# Patient Record
Sex: Female | Born: 1990
Health system: Southern US, Community
[De-identification: ages and names within clinical notes are randomized; demographics above are authoritative.]

## PROBLEM LIST (undated history)

## (undated) DIAGNOSIS — O139 Gestational [pregnancy-induced] hypertension without significant proteinuria, unspecified trimester: Secondary | ICD-10-CM

## (undated) HISTORY — PX: EYE SURGERY: SHX253

## (undated) HISTORY — PX: CHOLECYSTECTOMY: SHX55

---
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
## 2016-02-03 DIAGNOSIS — N771 Vaginitis, vulvitis and vulvovaginitis in diseases classified elsewhere: Secondary | ICD-10-CM | POA: Diagnosis not present

## 2016-04-10 DIAGNOSIS — R1013 Epigastric pain: Secondary | ICD-10-CM | POA: Diagnosis not present

## 2016-04-10 DIAGNOSIS — R079 Chest pain, unspecified: Secondary | ICD-10-CM | POA: Diagnosis not present

## 2016-04-10 DIAGNOSIS — R740 Nonspecific elevation of levels of transaminase and lactic acid dehydrogenase [LDH]: Secondary | ICD-10-CM | POA: Diagnosis not present

## 2016-04-10 DIAGNOSIS — R1110 Vomiting, unspecified: Secondary | ICD-10-CM | POA: Diagnosis not present

## 2016-04-10 DIAGNOSIS — N390 Urinary tract infection, site not specified: Secondary | ICD-10-CM | POA: Diagnosis not present

## 2017-07-31 DIAGNOSIS — A5901 Trichomonal vulvovaginitis: Secondary | ICD-10-CM | POA: Diagnosis not present

## 2017-07-31 DIAGNOSIS — K219 Gastro-esophageal reflux disease without esophagitis: Secondary | ICD-10-CM | POA: Diagnosis not present

## 2017-07-31 DIAGNOSIS — Z9889 Other specified postprocedural states: Secondary | ICD-10-CM | POA: Diagnosis not present

## 2017-07-31 DIAGNOSIS — N760 Acute vaginitis: Secondary | ICD-10-CM | POA: Diagnosis not present

## 2017-07-31 DIAGNOSIS — F419 Anxiety disorder, unspecified: Secondary | ICD-10-CM | POA: Diagnosis not present

## 2017-07-31 DIAGNOSIS — A5900 Urogenital trichomoniasis, unspecified: Secondary | ICD-10-CM | POA: Diagnosis not present

## 2017-07-31 DIAGNOSIS — B9689 Other specified bacterial agents as the cause of diseases classified elsewhere: Secondary | ICD-10-CM | POA: Diagnosis not present

## 2017-07-31 DIAGNOSIS — Z9049 Acquired absence of other specified parts of digestive tract: Secondary | ICD-10-CM | POA: Diagnosis not present

## 2018-01-20 DIAGNOSIS — J069 Acute upper respiratory infection, unspecified: Secondary | ICD-10-CM | POA: Diagnosis not present

## 2018-01-20 DIAGNOSIS — R509 Fever, unspecified: Secondary | ICD-10-CM | POA: Diagnosis not present

## 2018-01-20 DIAGNOSIS — R05 Cough: Secondary | ICD-10-CM | POA: Diagnosis not present

## 2018-01-20 DIAGNOSIS — R0981 Nasal congestion: Secondary | ICD-10-CM | POA: Diagnosis not present

## 2018-01-20 DIAGNOSIS — J3489 Other specified disorders of nose and nasal sinuses: Secondary | ICD-10-CM | POA: Diagnosis not present

## 2018-03-03 ENCOUNTER — Other Ambulatory Visit: Payer: Self-pay

## 2018-03-03 ENCOUNTER — Emergency Department (HOSPITAL_COMMUNITY)
Admission: EM | Admit: 2018-03-03 | Discharge: 2018-03-04 | Disposition: A | Discharge status: Home / Self Care | Payer: Medicaid Other | Attending: Emergency Medicine | Admitting: Emergency Medicine

## 2018-03-03 ENCOUNTER — Encounter (HOSPITAL_COMMUNITY): Payer: Self-pay

## 2018-03-03 DIAGNOSIS — R319 Hematuria, unspecified: Secondary | ICD-10-CM | POA: Diagnosis not present

## 2018-03-03 DIAGNOSIS — Z5321 Procedure and treatment not carried out due to patient leaving prior to being seen by health care provider: Secondary | ICD-10-CM | POA: Diagnosis not present

## 2018-03-03 LAB — URINALYSIS, ROUTINE W REFLEX MICROSCOPIC
Bilirubin Urine: NEGATIVE
Glucose, UA: NEGATIVE mg/dL
Ketones, ur: NEGATIVE mg/dL
Nitrite: NEGATIVE
Protein, ur: 100 mg/dL — AB
RBC / HPF: >50 RBC/hpf — ABNORMAL HIGH (ref 0–5)
Specific Gravity, Urine: 1.016 (ref 1.005–1.030)
WBC, UA: >50 WBC/hpf — ABNORMAL HIGH (ref 0–5)
pH: 7 (ref 5.0–8.0)

## 2018-03-03 NOTE — ED Triage Notes (Signed)
Patient c/o hematuria and dysuria that started today.

## 2018-03-04 DIAGNOSIS — N3001 Acute cystitis with hematuria: Secondary | ICD-10-CM | POA: Diagnosis not present

## 2018-03-04 NOTE — ED Notes (Signed)
Pt called to be roomed with no response.  RN notified. 

## 2019-01-11 DIAGNOSIS — Z3201 Encounter for pregnancy test, result positive: Secondary | ICD-10-CM | POA: Diagnosis not present

## 2019-01-11 LAB — PREGNANCY, URINE: Preg Test, Ur: POSITIVE

## 2019-01-17 ENCOUNTER — Other Ambulatory Visit: Payer: Self-pay | Admitting: Family Medicine

## 2019-01-17 MED ORDER — PRENATAL MULTIVITAMIN CH: 1.0000 | ORAL_TABLET | Freq: Every day | ORAL | Status: DC

## 2019-01-17 NOTE — Addendum Note (Signed)
Addended by: Ernestina Patches on: 01/17/2019 02:11 PM   Modules accepted: Orders

## 2019-01-17 NOTE — Telephone Encounter (Signed)
The patient would like prenatal vitiamins sent over to her pharmacy. [facility] [address]

## 2019-01-18 ENCOUNTER — Telehealth: Payer: Self-pay | Admitting: Nurse Practitioner

## 2019-01-18 NOTE — Telephone Encounter (Signed)
Patient still has not gotten he Rx. Please call her today.

## 2019-01-18 NOTE — Telephone Encounter (Signed)
LM for pt that I am returning her call.  If she continues to have questions or concerns to please call the office.    Addison Naegeli, RN

## 2019-01-19 ENCOUNTER — Other Ambulatory Visit: Payer: Self-pay

## 2019-01-19 MED ORDER — PRENATAL MULTIVITAMIN CH: 1.0000 | ORAL_TABLET | Freq: Every day | ORAL | Status: DC

## 2019-01-20 ENCOUNTER — Encounter: Payer: Self-pay | Admitting: *Deleted

## 2019-01-20 ENCOUNTER — Emergency Department (HOSPITAL_COMMUNITY): Payer: Medicaid Other

## 2019-01-20 ENCOUNTER — Other Ambulatory Visit: Payer: Self-pay

## 2019-01-20 ENCOUNTER — Emergency Department (HOSPITAL_COMMUNITY)
Admission: EM | Admit: 2019-01-20 | Discharge: 2019-01-20 | Disposition: A | Discharge status: Home / Self Care | Payer: Medicaid Other | Attending: Emergency Medicine | Admitting: Emergency Medicine

## 2019-01-20 ENCOUNTER — Encounter (HOSPITAL_COMMUNITY): Payer: Self-pay

## 2019-01-20 DIAGNOSIS — Z79899 Other long term (current) drug therapy: Secondary | ICD-10-CM | POA: Diagnosis not present

## 2019-01-20 DIAGNOSIS — R109 Unspecified abdominal pain: Secondary | ICD-10-CM | POA: Diagnosis present

## 2019-01-20 DIAGNOSIS — O99331 Smoking (tobacco) complicating pregnancy, first trimester: Secondary | ICD-10-CM | POA: Insufficient documentation

## 2019-01-20 DIAGNOSIS — Z3A01 Less than 8 weeks gestation of pregnancy: Secondary | ICD-10-CM | POA: Diagnosis not present

## 2019-01-20 DIAGNOSIS — O23591 Infection of other part of genital tract in pregnancy, first trimester: Secondary | ICD-10-CM | POA: Diagnosis not present

## 2019-01-20 DIAGNOSIS — O468X1 Other antepartum hemorrhage, first trimester: Secondary | ICD-10-CM

## 2019-01-20 DIAGNOSIS — O418X10 Other specified disorders of amniotic fluid and membranes, first trimester, not applicable or unspecified: Secondary | ICD-10-CM

## 2019-01-20 DIAGNOSIS — O4691 Antepartum hemorrhage, unspecified, first trimester: Secondary | ICD-10-CM | POA: Insufficient documentation

## 2019-01-20 DIAGNOSIS — F17210 Nicotine dependence, cigarettes, uncomplicated: Secondary | ICD-10-CM | POA: Insufficient documentation

## 2019-01-20 DIAGNOSIS — O208 Other hemorrhage in early pregnancy: Secondary | ICD-10-CM | POA: Diagnosis not present

## 2019-01-20 DIAGNOSIS — B9689 Other specified bacterial agents as the cause of diseases classified elsewhere: Secondary | ICD-10-CM

## 2019-01-20 DIAGNOSIS — O99891 Other specified diseases and conditions complicating pregnancy: Secondary | ICD-10-CM | POA: Insufficient documentation

## 2019-01-20 DIAGNOSIS — R102 Pelvic and perineal pain: Secondary | ICD-10-CM | POA: Diagnosis not present

## 2019-01-20 DIAGNOSIS — N760 Acute vaginitis: Secondary | ICD-10-CM | POA: Insufficient documentation

## 2019-01-20 DIAGNOSIS — O368910 Maternal care for other specified fetal problems, first trimester, not applicable or unspecified: Secondary | ICD-10-CM | POA: Diagnosis not present

## 2019-01-20 LAB — BASIC METABOLIC PANEL
Anion gap: 8 (ref 5–15)
BUN: 9 mg/dL (ref 6–20)
CO2: 23 mmol/L (ref 22–32)
Calcium: 8.6 mg/dL — ABNORMAL LOW (ref 8.9–10.3)
Chloride: 103 mmol/L (ref 98–111)
Creatinine, Ser: 0.51 mg/dL (ref 0.44–1.00)
GFR calc Af Amer: >60 mL/min (ref >60)
GFR calc non Af Amer: >60 mL/min (ref >60)
Glucose, Bld: 89 mg/dL (ref 70–99)
Potassium: 3.7 mmol/L (ref 3.5–5.1)
Sodium: 134 mmol/L — ABNORMAL LOW (ref 135–145)

## 2019-01-20 LAB — URINALYSIS, ROUTINE W REFLEX MICROSCOPIC
Bilirubin Urine: NEGATIVE
Glucose, UA: NEGATIVE mg/dL
Hgb urine dipstick: NEGATIVE
Ketones, ur: NEGATIVE mg/dL
Nitrite: NEGATIVE
Protein, ur: NEGATIVE mg/dL
Specific Gravity, Urine: 1.019 (ref 1.005–1.030)
pH: 6 (ref 5.0–8.0)

## 2019-01-20 LAB — CBC WITH DIFFERENTIAL/PLATELET
Abs Immature Granulocytes: 0.01 10*3/uL (ref 0.00–0.07)
Basophils Absolute: 0 10*3/uL (ref 0.0–0.1)
Basophils Relative: 1 %
Eosinophils Absolute: 0.1 10*3/uL (ref 0.0–0.5)
Eosinophils Relative: 1 %
HCT: 34.9 % — ABNORMAL LOW (ref 36.0–46.0)
Hemoglobin: 10.5 g/dL — ABNORMAL LOW (ref 12.0–15.0)
Immature Granulocytes: 0 %
Lymphocytes Relative: 45 %
Lymphs Abs: 2.6 10*3/uL (ref 0.7–4.0)
MCH: 24.1 pg — ABNORMAL LOW (ref 26.0–34.0)
MCHC: 30.1 g/dL (ref 30.0–36.0)
MCV: 80.2 fL (ref 80.0–100.0)
Monocytes Absolute: 0.5 10*3/uL (ref 0.1–1.0)
Monocytes Relative: 9 %
Neutro Abs: 2.4 10*3/uL (ref 1.7–7.7)
Neutrophils Relative %: 44 %
Platelets: 413 10*3/uL — ABNORMAL HIGH (ref 150–400)
RBC: 4.35 MIL/uL (ref 3.87–5.11)
RDW: 16.5 % — ABNORMAL HIGH (ref 11.5–15.5)
WBC: 5.6 10*3/uL (ref 4.0–10.5)
nRBC: 0 % (ref 0.0–0.2)

## 2019-01-20 LAB — WET PREP, GENITAL
Sperm: NONE SEEN
Trich, Wet Prep: NONE SEEN
Yeast Wet Prep HPF POC: NONE SEEN

## 2019-01-20 LAB — POC URINE PREG, ED: Preg Test, Ur: POSITIVE — AB

## 2019-01-20 LAB — HCG, QUANTITATIVE, PREGNANCY: hCG, Beta Chain, Quant, S: 49198 m[IU]/mL — ABNORMAL HIGH (ref <5)

## 2019-01-20 LAB — ABO/RH: ABO/RH(D): O POS

## 2019-01-20 MED ORDER — METRONIDAZOLE 500 MG PO TABS: 500.0000 mg | ORAL_TABLET | Freq: Two times a day (BID) | ORAL | 0 refills | Status: DC

## 2019-01-20 MED ORDER — PREPLUS 27-1 MG PO TABS: 1.0000 | ORAL_TABLET | Freq: Every day | ORAL | 6 refills | Status: DC

## 2019-01-20 NOTE — Telephone Encounter (Signed)
Pt called upset requesting to have her PNV filled.  Per chart review, medication was not order correctly.  I advised pt that I would e-prescribe PNV and send them to her [facility] pharmacy on [address] [facility].  Pt verbalized understanding.   Addison Naegeli, RN 01/20/19

## 2019-01-20 NOTE — Discharge Instructions (Signed)
Please begin taking a prenatal vitamin with iron. Take the antibiotic, metronidazole every 12 hours until gone. Follow-up closely with women's outpatient clinic. If you develop severely worsening abdominal pain, began vaginal bleeding, or new or concerning symptoms regarding your pregnancy, you can report to the MAU at the [facility] on the [facility].

## 2019-01-20 NOTE — ED Provider Notes (Signed)
[place] DEPT Provider Note   CSN: [id number] Arrival date & time: 01/20/19  0623     History Chief Complaint  Patient presents with  . Abdominal Pain    Pregnant    Sherri Clark is a 29 y.o. female G2P1, presenting to the ED with intermittent worsening left sided abd pain that began a few days ago. Pt states symptoms initially were mild, coming and going, however last night became severe. Pain is described as intermittent sharp pains, no aggravating factors. She does endorse some vaginal itching as well, feels similar to previous yeast infection. [facility] [place] [facility]. She has upcoming appointment with [facility] out[facility] women's clinic for OB care. States it has been nearly 10 years since last pregnancy, where she delivered by emergent c-section preterm due to fetal distress. No other reported complications during previous pregnancy. No assoc n/v, d/c, urinary sx, vaginal bleeding or discharge. LMP is week before Thanksgiving holiday.  [facility] surgeries include cholecystectomy  The history is provided by the patient.       History reviewed. No pertinent past medical history.  There are no problems to display for this patient.   Past Surgical History:  Procedure Laterality Date  . CHOLECYSTECTOMY    . EYE SURGERY       OB History    Gravida  1   Para      Term      Preterm      AB      Living        SAB      TAB      Ectopic      Multiple      Live Births              Family History  Problem Relation Age of Onset  . Lupus Mother   . Hypertension Father     Social History   Tobacco Use  . Smoking status: Current Some Day Smoker    Types: Cigarettes  . Smokeless tobacco: Never Used  Substance Use Topics  . Alcohol use: Never  . Drug use: Never    Home Medications Prior to Admission medications   Medication Sig Start Date End Date Taking?  Authorizing Provider  acetaminophen (TYLENOL) 500 MG tablet Take 500 mg by mouth every 6 (six) hours as needed for mild pain or moderate pain.   Yes [provider]  metroNIDAZOLE (FLAGYL) 500 MG tablet Take 1 tablet (500 mg total) by mouth 2 (two) times daily. 01/20/19   Othella Slappey, [place] N, PA-C    Allergies    Patient has no known allergies.  Review of Systems   Review of Systems  All other systems reviewed and are negative.   Physical Exam Updated Vital Signs BP 131/67 (BP Location: Right Arm)   Pulse 79   Temp 98.8 F (37.1 C) (Oral)   Resp 16   LMP 02/22/2018   SpO2 100%   Physical Exam Vitals and nursing note reviewed.  Constitutional:      Appearance: She is well-developed.  HENT:     Head: Normocephalic and atraumatic.  Eyes:     Conjunctiva/sclera: Conjunctivae normal.  Cardiovascular:     Rate and Rhythm: Normal rate and regular rhythm.  Pulmonary:     Effort: Pulmonary effort is normal. No respiratory distress.     Breath sounds: Normal breath sounds.  Abdominal:     General: Bowel sounds are normal.  Palpations: Abdomen is soft.     Tenderness: There is no abdominal tenderness. There is no guarding or rebound.  Genitourinary:    Comments: Exam performed with female RN chaperone present.  Vulva is normal.  There is vaginal erythema present with moderate amount of white discharge present.  No CMT or adnexal tenderness. Skin:    General: Skin is warm.  Neurological:     Mental Status: She is alert.  Psychiatric:        Behavior: Behavior normal.     ED Results / Procedures / Treatments   Labs (all labs ordered are listed, but only abnormal results are displayed) Labs Reviewed  WET PREP, GENITAL - Abnormal; Notable for the following components:      Result Value   Clue Cells Wet Prep HPF POC PRESENT (*)    WBC, Wet Prep HPF POC MANY (*)    All other components within normal limits  HCG, QUANTITATIVE, PREGNANCY - Abnormal; Notable for the  following components:   hCG, Beta Chain, Quant, S 49,198 (*)    All other components within normal limits  CBC WITH DIFFERENTIAL/PLATELET - Abnormal; Notable for the following components:   Hemoglobin 10.5 (*)    HCT 34.9 (*)    MCH 24.1 (*)    RDW 16.5 (*)    Platelets 413 (*)    All other components within normal limits  BASIC METABOLIC PANEL - Abnormal; Notable for the following components:   Sodium 134 (*)    Calcium 8.6 (*)    All other components within normal limits  URINALYSIS, ROUTINE W REFLEX MICROSCOPIC - Abnormal; Notable for the following components:   APPearance CLOUDY (*)    Leukocytes,Ua MODERATE (*)    Bacteria, UA MANY (*)    All other components within normal limits  POC URINE PREG, ED - Abnormal; Notable for the following components:   Preg Test, Ur POSITIVE (*)    All other components within normal limits  ABO/RH  GC/CHLAMYDIA PROBE AMP ([place]) NOT AT [facility]    EKG None  Radiology US OB Comp < 14 Wks  Result Date: 01/20/2019 CLINICAL DATA:  Pelvic pain EXAM: OBSTETRIC <14 WK [place] AND TRANSVAGINAL OB US TECHNIQUE: Both transabdominal and transvaginal ultrasound examinations were performed for complete evaluation of the gestation as well as the maternal uterus, adnexal regions, and pelvic cul-de-sac. Transvaginal technique was performed to assess early pregnancy. COMPARISON:  None. FINDINGS: Intrauterine gestational sac: Visualized Yolk sac:  Visualized Embryo:  Visualized Cardiac Activity: Visualized Heart Rate: 165 bpm CRL:  7 mm   6 w   4 d                  [place] [facility]: September 11, 2019 Subchorionic hemorrhage: There is a 1.1 x 0.7 cm subchorionic hemorrhage. Maternal uterus/adnexae: Cervical os is closed. There is a 2.1 x 2.0 x 1.8 cm corpus luteum on the left. No other extrauterine pelvic mass. No free pelvic fluid. IMPRESSION: Single live intrauterine gestation with estimated gestational age of 6+ weeks. Subchorionic hemorrhage measuring 1.1 x 0.7 cm noted. No  extrauterine pelvic mass beyond apparent corpus luteum on the left. No appreciable free pelvic fluid. Electronically Signed   By: Bretta Bang III M.D.   On: 01/20/2019 13:27   US OB Transvaginal  Result Date: 01/20/2019 CLINICAL DATA:  Pelvic pain EXAM: OBSTETRIC <14 WK [place] AND TRANSVAGINAL OB US TECHNIQUE: Both transabdominal and transvaginal ultrasound examinations were performed for complete evaluation of the gestation  as well as the maternal uterus, adnexal regions, and pelvic cul-de-sac. Transvaginal technique was performed to assess early pregnancy. COMPARISON:  None. FINDINGS: Intrauterine gestational sac: Visualized Yolk sac:  Visualized Embryo:  Visualized Cardiac Activity: Visualized Heart Rate: 165 bpm CRL:  7 mm   6 w   4 d                  [place] [facility]: September 11, 2019 Subchorionic hemorrhage: There is a 1.1 x 0.7 cm subchorionic hemorrhage. Maternal uterus/adnexae: Cervical os is closed. There is a 2.1 x 2.0 x 1.8 cm corpus luteum on the left. No other extrauterine pelvic mass. No free pelvic fluid. IMPRESSION: Single live intrauterine gestation with estimated gestational age of 6+ weeks. Subchorionic hemorrhage measuring 1.1 x 0.7 cm noted. No extrauterine pelvic mass beyond apparent corpus luteum on the left. No appreciable free pelvic fluid. Electronically Signed   By: Bretta Bang III M.D.   On: 01/20/2019 13:27    Procedures Procedures (including critical care time)  Medications Ordered in ED Medications - No data to display  ED Course  I have reviewed the triage vital signs and the nursing notes.  Pertinent labs & imaging results that were available during my care of the patient were reviewed by me and considered in my medical decision making (see chart for details).    MDM Rules/Calculators/A&P                      Patient with recent positive pregnancy test presenting with intermittent right sided abdominal pain over the last couple of days.  No vaginal bleeding though  patient does report vaginal discomfort and itching.  On exam, patient is not currently having abdominal pain nor is her abdominal tenderness.  Pelvic exam with moderate amount of white discharge and vaginal erythema.  Wet prep with clue cells and many white cells, no trichomoniasis or yeast.  Pregnancy test is positive, quantitative hCG is 49,000.  Patient is Rh+ does not require RhoGam.  Pelvic ultrasound with live intrauterine fetus with estimated gestational age of [redacted] weeks and 4 days.  There is also noted to be a small subchorionic hemorrhage measuring 1.1 x 0.7 cm.  Discussed work-up findings with patient.  As patient is symptomatic regarding bacterial vaginosis, will treat per current guidelines with p.o. Flagyl twice daily x1 week.  Patient is aware she needs to follow closely with her OB for monitoring of subchorionic hemorrhage.  Instructed she begin taking multivitamin with iron given patient's slightly low hemoglobin today of 10.5.  Urine appears contaminated, culture sent and patient is without urinary symptoms.  Patient is aware of this.  Will discharge with strict return precautions and instructions to follow-up closely outpatient.  Patient is agreeable to plan, well-appearing and safe for discharge.  Discussed results, findings, treatment and follow up. Patient advised of return precautions. Patient verbalized understanding and agreed with plan.  Final Clinical Impression(s) / ED Diagnoses Final diagnoses:  Less than [redacted] weeks gestation of pregnancy  Subchorionic hemorrhage of placenta in first trimester, single or unspecified fetus  Bacterial vaginosis    Rx / DC Orders ED Discharge Orders         Ordered    metroNIDAZOLE (FLAGYL) 500 MG tablet  2 times daily     01/20/19 1420           Nylene Inlow, [place] N, [place] 01/20/19 1434    Pricilla Loveless, MD 01/22/19 [phone]

## 2019-01-20 NOTE — ED Triage Notes (Signed)
Pt states that she is [redacted] weeks pregnant and is having sharp abd pain. Pt states that she is also having some vaginal discomfort and is concerned she has a yeast infection.

## 2019-01-21 ENCOUNTER — Ambulatory Visit: Payer: Medicaid Other

## 2019-01-21 LAB — GC/CHLAMYDIA PROBE AMP (~~LOC~~) NOT AT ARMC
Chlamydia: NEGATIVE
Neisseria Gonorrhea: NEGATIVE

## 2019-01-31 ENCOUNTER — Ambulatory Visit (INDEPENDENT_AMBULATORY_CARE_PROVIDER_SITE_OTHER): Payer: Medicaid Other | Admitting: *Deleted

## 2019-01-31 ENCOUNTER — Encounter: Payer: Self-pay | Admitting: *Deleted

## 2019-01-31 ENCOUNTER — Other Ambulatory Visit: Payer: Self-pay

## 2019-01-31 DIAGNOSIS — Z8619 Personal history of other infectious and parasitic diseases: Secondary | ICD-10-CM

## 2019-01-31 DIAGNOSIS — O0990 Supervision of high risk pregnancy, unspecified, unspecified trimester: Secondary | ICD-10-CM

## 2019-01-31 DIAGNOSIS — O09899 Supervision of other high risk pregnancies, unspecified trimester: Secondary | ICD-10-CM

## 2019-01-31 MED ORDER — BLOOD PRESSURE KIT DEVI: 1.0000 | 0 refills | Status: DC | PRN

## 2019-01-31 NOTE — Progress Notes (Addendum)
1:24 I called Julissa for her telephone visit and heard a message " Your call cannot be completed at this time".  Jernard Reiber,RN  I connected with  Erlinda Summerson on 01/31/19 at  1:30 PM EST by telephone and verified that I am speaking with the correct person using two identifiers.   I discussed the limitations, risks, security and privacy concerns of performing an evaluation and management service by telephone and the availability of in person appointments. I also discussed with the patient that there may be a patient responsible charge related to this service. The patient expressed understanding and agreed to proceed.   Explained I am completing her New OB Intake today. We discussed Her EDD and that it is based on  Early [place] . I reviewed her allergies, meds, OB History, Medical /Surgical history, and appropriate screenings. I explained I will send her the Babyscripts app- app sent to her while on phone.  I explained we will send a blood pressure cuff prescription  to [facility] that will fill that prescription. We called the pharmacy and she verified her information and that she would pickup the cuff. I asked her to bring the blood pressure cuff with her to her first ob appointment so we can show her how to use it. Explained  then we will have her take her blood pressure weekly and enter into the app. Explained she will have some visits in office and some virtually. She already has MyChart and I assisted her with downloading the  app. I reviewed her new ob  appointment date/ time with her , our location and to wear mask, no visitors.  I explained she will have a pelvic exam, whatever remaining  ob bloodwork not done in ED, hemoglobin a1C, cbg , genetic testing if desired,- she does want a panorama,  pap if needed. I scheduled an [place] at 19 weeks and gave her the appointment. She voices understanding.   Ellakate Gonsalves,RN 01/31/2019  1:37 PM   Chart reviewed for nurse visit. Agree with plan of care.   Currie Paris, NP 01/31/2019 2:41 PM

## 2019-01-31 NOTE — Patient Instructions (Signed)

## 2019-02-09 DIAGNOSIS — O0990 Supervision of high risk pregnancy, unspecified, unspecified trimester: Secondary | ICD-10-CM | POA: Diagnosis not present

## 2019-02-28 ENCOUNTER — Encounter: Payer: Self-pay | Admitting: Nurse Practitioner

## 2019-02-28 ENCOUNTER — Other Ambulatory Visit: Payer: Self-pay

## 2019-02-28 ENCOUNTER — Other Ambulatory Visit (HOSPITAL_COMMUNITY)
Admission: RE | Admit: 2019-02-28 | Discharge: 2019-02-28 | Disposition: A | Discharge status: Home / Self Care | Payer: Medicaid Other | Source: Ambulatory Visit | Attending: Nurse Practitioner | Admitting: Nurse Practitioner

## 2019-02-28 ENCOUNTER — Ambulatory Visit (INDEPENDENT_AMBULATORY_CARE_PROVIDER_SITE_OTHER): Payer: Medicaid Other | Admitting: Nurse Practitioner

## 2019-02-28 VITALS — BP 125/68 | HR 83 | Wt 208.0 lb

## 2019-02-28 DIAGNOSIS — O0991 Supervision of high risk pregnancy, unspecified, first trimester: Secondary | ICD-10-CM | POA: Diagnosis not present

## 2019-02-28 DIAGNOSIS — O09891 Supervision of other high risk pregnancies, first trimester: Secondary | ICD-10-CM | POA: Diagnosis not present

## 2019-02-28 DIAGNOSIS — O210 Mild hyperemesis gravidarum: Secondary | ICD-10-CM | POA: Diagnosis not present

## 2019-02-28 DIAGNOSIS — O0990 Supervision of high risk pregnancy, unspecified, unspecified trimester: Secondary | ICD-10-CM | POA: Diagnosis not present

## 2019-02-28 DIAGNOSIS — O09899 Supervision of other high risk pregnancies, unspecified trimester: Secondary | ICD-10-CM

## 2019-02-28 DIAGNOSIS — Z3A12 12 weeks gestation of pregnancy: Secondary | ICD-10-CM

## 2019-02-28 DIAGNOSIS — Z98891 History of uterine scar from previous surgery: Secondary | ICD-10-CM | POA: Insufficient documentation

## 2019-02-28 DIAGNOSIS — Z6829 Body mass index (BMI) 29.0-29.9, adult: Secondary | ICD-10-CM | POA: Insufficient documentation

## 2019-02-28 MED ORDER — DOXYLAMINE-PYRIDOXINE 10-10 MG PO TBEC: DELAYED_RELEASE_TABLET | ORAL | 2 refills | Status: DC

## 2019-02-28 NOTE — Progress Notes (Signed)
Subjective:   Sherri Clark is a 29 y.o. G2P0101 at 69w1dby early ultrasound being seen today for her first obstetrical visit.  Her obstetrical history is significant for history of cesarean birth and preterm birth due to hypertension. Patient does intend to breast feed. Pregnancy history fully reviewed.  Patient reports nausea.  HISTORY: OB History  Gravida Para Term Preterm AB Living  2 1 0 1 0 1  SAB TAB Ectopic Multiple Live Births  0 0 0 0 1    # Outcome Date GA Lbr Len/2nd Weight Sex Delivery Anes PTL Lv  2 Current           1 Preterm 01/14/10 312w0d2 lb 4 oz (1.021 kg)  CS-Unspec EPI  LIV     Birth Comments: PTD; c/s fetal distress    Past Medical History:  Diagnosis Date  . Gonorrhea 2013   Past Surgical History:  Procedure Laterality Date  . CESAREAN SECTION    . CHOLECYSTECTOMY    . EYE SURGERY     Family History  Problem Relation Age of Onset  . Lupus Mother   . Hypertension Father    Social History   Tobacco Use  . Smoking status: Former Smoker    Types: Cigarettes    Quit date: 01/30/2017    Years since quitting: 2.0  . Smokeless tobacco: Never Used  Substance Use Topics  . Alcohol use: Never  . Drug use: Never   No Known Allergies Current Outpatient Medications on File Prior to Visit  Medication Sig Dispense Refill  . acetaminophen (TYLENOL) 500 MG tablet Take 500 mg by mouth every 6 (six) hours as needed for mild pain or moderate pain.    . Prenatal Vit-Fe Fumarate-FA (PREPLUS) 27-1 MG TABS Take 1 tablet by mouth daily. 30 tablet 6  . Blood Pressure Monitoring (BLOOD PRESSURE KIT) DEVI 1 Device by Does not apply route as needed. (Patient not taking: Reported on 02/28/2019) 1 each 0   Current Facility-Administered Medications on File Prior to Visit  Medication Dose Route Frequency Provider Last Rate Last Admin  . prenatal multivitamin tablet 1 tablet  1 tablet Oral Q1200 Burleson, Terri L, NP      . prenatal multivitamin tablet 1  tablet  1 tablet Oral Q1200 ArWoodroe ModeMD         Exam   Vitals:   02/28/19 0855  BP: 125/68  Pulse: 83  Weight: 208 lb (94.3 kg)   Fetal Heart Rate (bpm): 142  Uterus:     Pelvic Exam: Perineum: no hemorrhoids, normal perineum   Vulva: normal external genitalia, no lesions   Vagina:  normal mucosa, normal discharge   Cervix: no lesions and normal, pap smear done.    Adnexa: normal adnexa and no mass, fullness, tenderness   Bony Pelvis: average  System: General: well-developed, well-nourished female in no acute distress   Breast:  normal appearance, no masses or tenderness   Skin: normal coloration and turgor, no rashes   Neurologic: oriented, normal, negative, normal mood   Extremities: normal strength, tone, and muscle mass, ROM of all joints is normal   HEENT extraocular movement intact and sclera clear, anicteric   Mouth/Teeth deferred   Neck supple and no masses, normal thyroid   Cardiovascular: regular rate and rhythm   Respiratory:  no respiratory distress, normal breath sounds   Abdomen: soft, non-tender; no masses,  no organomegaly     Assessment:   Pregnancy: [id number]  Patient Active Problem List   Diagnosis Date Noted  . History of cesarean delivery 02/28/2019  . Supervision of high risk pregnancy, antepartum 01/31/2019  . History of preterm delivery, currently pregnant 01/31/2019  . History of gonorrhea 01/31/2019     Plan:  1. Supervision of high risk pregnancy, antepartum Doing well Reviewed eating protein at every meal. Advised to consider online childbirth and breastfeeding classes this time for more knowledge on how to manage pain in labor. Reviewed weight gain in pregnancy - advised weight gain of 20 pounds.  Encouraged to eat healthy options and not diet.  Expect some continued weight gain. Reviewed Babyscripts and MyChart apps. Does not yet have BP cuff.  Has been ordered.  - Culture, OB Urine - Genetic Screening - Obstetric Panel,  Including HIV - Cytology - PAP - Hemoglobin A1c  2. History of cesarean delivery Prefers VBAC Will need to meet with MD for delivery plan  3. History of preterm delivery, currently pregnant Delivered by C/S due to hypertension and decreased fetal movement 17P is not indicated.  Reviewed with Dr. Kennon Rounds.  4.  Morning sickness Prescribed diclegis  Initial labs drawn. Continue prenatal vitamins. Genetic Screening discussed, NIPS: ordered. Ultrasound discussed; fetal anatomic survey: ordered. Problem list reviewed and updated. The nature of [facility] with multiple MDs and other Advanced Practice Providers was explained to patient; also emphasized that residents, students are part of our team. Routine obstetric precautions reviewed. Return in about 4 weeks (around 03/28/2019) for virtual visit.  Total face-to-face time with patient: 40 minutes.  Over 50% of encounter was spent on counseling and coordination of care.     Earlie Server, FNP Family Nurse Practitioner, [facility] for [employer], [facility] 02/28/2019 9:35 AM

## 2019-02-28 NOTE — Progress Notes (Signed)
Medicaid Home Form Completed-02/28/19

## 2019-02-28 NOTE — Patient Instructions (Addendum)
Morning Sickness  Morning sickness is when you feel sick to your stomach (nauseous) during pregnancy. You may feel sick to your stomach and throw up (vomit). You may feel sick in the morning, but you can feel this way at any time of day. Some women feel very sick to their stomach and cannot stop throwing up (hyperemesis gravidarum). Follow these instructions at home: Medicines  Take over-the-counter and prescription medicines only as told by your doctor. Do not take any medicines until you talk with your doctor about them first.  Taking multivitamins before getting pregnant can stop or lessen the harshness of morning sickness. Eating and drinking  Eat dry toast or crackers before getting out of bed.  Eat 5 or 6 small meals a day.  Eat dry and bland foods like rice and baked potatoes.  Do not eat greasy, fatty, or spicy foods.  Have someone cook for you if the smell of food causes you to feel sick or throw up.  If you feel sick to your stomach after taking prenatal vitamins, take them at night or with a snack.  Eat protein when you need a snack. Nuts, yogurt, and cheese are good choices.  Drink fluids throughout the day.  Try ginger ale made with real ginger, ginger tea made from fresh grated ginger, or ginger candies. General instructions  Do not use any products that have nicotine or tobacco in them, such as cigarettes and e-cigarettes. If you need help quitting, ask your doctor.  Use an air purifier to keep the air in your house free of smells.  Get lots of fresh air.  Try to avoid smells that make you feel sick.  Try: ? Wearing a bracelet that is used for seasickness (acupressure wristband). ? Going to a doctor who puts thin needles into certain body points (acupuncture) to improve how you feel. Contact a doctor if:  You need medicine to feel better.  You feel dizzy or light-headed.  You are losing weight. Get help right away if:  You feel very sick to your  stomach and cannot stop throwing up.  You pass out (faint).  You have very bad pain in your belly. Summary  Morning sickness is when you feel sick to your stomach (nauseous) during pregnancy.  You may feel sick in the morning, but you can feel this way at any time of day.  Making some changes to what you eat may help your symptoms go away. This information is not intended to replace advice given to you by your health care provider. Make sure you discuss any questions you have with your health care provider. Document Revised: 12/12/2016 Document Reviewed: 01/31/2016 Elsevier Patient Education  2020 [place] [phone]) . [facility] Davy Pique, MD; Gwendlyn Deutscher, MD; Walker Kehr, MD; Andria Frames, MD; McDiarmid, MD; Dutch Quint, MD; Nori Riis, MD; Mingo Amber, [address] [place], [place] [address] o [phone] o Mon-Fri 8:30-12:30, 1:30-5:00 o Providers come to see babies at [facility] o Accepting Medicaid . [facility] providers who accept newborns: Dorthy Cooler, MD; Orland Mustard, MD; Stephanie Acre, MD o [place], [place], [place] [address] o [phone] o Mon-Fri 8:00-5:30 o Babies seen by providers at [facility] o Does NOT accept Medicaid o Please call early in hospitalization for appointment (limited availability)  . [place]ard [place], MD o [address] [place], [place] [address] o [phone], [place], [place],  Fri 8:30-5:00, Wed 10:00-7:00 (closed 1-2pm) o Babies seen by [facility] providers o Accepting Medicaid . Donnie Coffin - Pediatrician Fae Pippin, MD o [address], [place], [place] [address] o [phone] o Mon-Fri 8:30-5:00, Sat 8:30-12:00 o Provider comes to see babies at [facility] o Accepting Medicaid o Must have been referred from current patients or contacted office prior to  delivery . [facility]) Leotis Pain, MD; Ave Filter, MD; Luna Fuse, MD; Kennedy Bucker, MD; Konrad Dolores, MD; Kathlene November, MD; Jenne Campus, MD; Lubertha South, MD; Wynetta Emery, MD; Duffy Rhody, MD; Gerre Couch, NP; Shirl Harris, NP o [address] [place], [place], [place] [address] o [phone] o Mon, Tue, Thur, Fri 8:30-5:30, Wed 9:30-5:30, Sat 8:30-12:30 o Babies seen by [facility] providers o Accepting Medicaid o Only accepting infants of first-time parents or siblings of current patients [facility] discharge coordinator will make follow-up appointment . Cyril Mourning o [address], [place], [place]  [address] o [phone]   Fax - [phone] . [facility] o [address], [place], [place]  [address] o Phone - [phone]   Fax - [phone] . Lucio Edward o [address], [place], [place]  [address] o [phone]  East/Northeast  [phone]) . [facility] Jorge Mandril, MD; Alita Chyle, MD; Princella Ion, MD; MD; Earlene Plater, MD; Jamesetta Orleans, MD; Alvera Novel, MD; Clarene Duke, MD; Rana Snare, MD; Carmon Ginsberg, MD; Alinda Money, MD; Hosie Poisson, MD; Mayford Knife, MD o [address] [place], [place] [address] o [phone] o Mon-Fri 8:30-5:00 (extended evenings Mon-Thur as needed), Sat-Sun 10:00-1:00 o Providers come to see babies at [facility] o Accepting Medicaid for families of first-time babies and families with all children in the household age 41 and under. Must register with office prior to making appointment (M-F only). [facility] Odella Aquas, NP; Lynelle Doctor, MD; Susann Givens, MD; [place], [place] o [address] [place], [place] [address] o [phone] o Mon-Fri 8:00-5:00 o Babies seen by providers at [facility] o Does NOT accept Medicaid/Commercial Insurance Only . [facility] [place] ([facility])  Suzette Battiest, MD; Zachery Dauer, MD; Stefan Church, MD; Sabino Dick, MD; Quitman Livings, MD; Farris Has, MD;  Gaynell Face, MD; Betha Loa, MD; Colon Flattery, MD; Clifton James, MD o [address] [place] [address] [place], [place] [address] o [phone] o Mon-Fri 8:30-5:30, Sat (Oct.-Mar.) 9:00-1:00 o Babies seen by providers at [facility] o Accepting [facility] [phone]) . [facility] Dimitri, MD; Sheliah Hatch, MD o [address], [place], [place] [address] o [phone] o Mon-Fri 8:30-5:00, Sat 8:30-12:00 o Providers come to see babies at [facility] o Does NOT accept Medicaid . [facility] Cindy Hazy, [place]; [place], MD; [place], [place]; Wynelle Link, MD; Azucena Cecil, MD o [address] [place], [place] [address] o [phone] o Mon-Fri 8:00-5:00 o Babies seen by providers at [facility] o Does NOT accept Medicaid o Only accepting babies of parents who are patients o Please call early in hospitalization for appointment (limited availability) . [facility] Lamar Benes, MD; Abran Cantor, MD; Early Osmond, MD; Cherre Huger, NP; Hyacinth Meeker, MD; Dwan Bolt, MD; Jarold Motto, NP; Dario Guardian, MD; Talmage Nap, MD; Maisie Fus, MD; Pricilla Holm, MD; Tama High, MD o [address] [place], [place], [place] [address] o [phone] o Mon-Fri 8:00-5:00, Sat 9:00-12:00 o Providers come to see babies at [facility] o Does NOT accept [facility] [phone]) . [facility] at [facility] o Limited providers accepting new patients: Drema Pry, NP;  Delena Serve, PA o [address], [place], [place] [address] o [phone] o Mon-Fri 8:00-5:00 o Babies seen by providers at [facility] o Does NOT accept Medicaid o Only accepting babies of parents who are patients o Please call early in hospitalization for appointment (limited availability) . [facility] Luan Pulling, MD; Nash Dimmer, MD o [address] [place] [place], [place] [address] o [phone] (press 1 to schedule appointment) o Mon-Fri 8:00-5:00 o Providers come to see babies at [facility] o Does NOT accept  Medicaid . [facility] Cristino Martes, MD o [address], [place], [place] [address] o [phone] o Mon-Fri 8:30-5:00 (lunch 12:30-1:00), extended hours by appointment only Wed 5:00-6:30 o Babies seen by [facility] providers o Accepting Medicaid . [facility] G[facility] Abbot, MD; [place], MD; Hassan Rowan, MD o [address] [place], [place], [place] [address] o [phone] o Mon-Fri 8:00-5:00 o Babies seen by [facility] providers o Does NOT accept Medicaid . Nature conservation officer at [place] [address] [place] Soho, MD; Durene Cal, MD; [place], DO o [address] , [place] [address] o [phone] o Mon-Fri 8:00-5:00 o Babies seen by [facility] providers o Does NOT accept Medicaid . [facility] o [place], [place]; [place], [place]; [place], NP; Avis Epley, MD; Vonna Kotyk, MD; Clance Boll, MD; Stevphen Rochester, NP; Arvilla Market, NP; Ann Maki, NP; Otis Dials, NP; Vaughan Basta, MD; [place], MD o [address] [place], [place] [address] o [phone] o Mon-Fri 8:30-5:00, Sat 10:00-1:00 o Providers come to see babies at [facility] o Does NOT accept Medicaid o Free prenatal information session Tuesdays at 4:45pm . [facility] Luna Kitchens, MD; [place], [place]; [place], [place]; [place], [place] o [address] [place] [address] o [phone] o Mon-Fri 7:30-5:30 o Babies seen by [facility] providers . [facility] o [address], [place], [place]  [address] o [phone]   Fax - [phone]  [place] [phone] & [phone]) . [facility] Alphonsa Overall, MD o [address] [place], [place] [address] o [phone] o Mon-Thur 8:00-6:00 o Providers come to see babies at [facility] o Accepting Medicaid . [facility] Zenon Mayo, NP; Cyndia Bent, MD; [place], [place]; [place], [place] o [address] [place], [place] [address] o [phone] o Mon-Thur 7:30-7:30, Fri 7:30-4:30 o Babies seen by  [facility] providers o Accepting Medicaid . [facility] Cheryle Horsfall, MD; Janene Harvey, NP; Vonita Moss, MD o [address], [place], [place] [address] o [phone] o Mon-Fri 8:30-5:00, Sat 8:30-12:00 o Providers come to see babies at [facility] o Accepting Medicaid o Must have "Meet & Greet" appointment at office prior to delivery . [facility] [place] ([facility] [place]) Llana Aliment, MD; Earlene Plater, MD; Lucretia Roers, MD o [address], [place], [place] [address] o [phone] o Mon-Wed 8:00-6:00, Thur-Fri 8:00-5:00, Sat 9:00-12:00 o Providers come to see babies at [facility] o Does NOT accept Medicaid o Only accepting siblings of current patients . [facility] [place]  o [address], [place], [place]  [address] o [phone]   Fax - [phone] . [facility] at [facility] o [phone] [address] [place], [place]  [address] o [phone]   Fax - [phone]  [place] [phone] & [phone]) . Nature conservation officer at [facility] o [place], DO; [place], DO o [address] [place], [place] [address] o [phone] o Mon-Fri 7:00-5:00 o Babies seen by [facility] providers o Does NOT accept Medicaid .  [facility] Ellis Savage, MD; [place], [place]; [place], [place] o [address], [place], [place] [address] o [phone] o Mon-Fri 8:00-5:00 o Babies seen by [facility] providers o Accepting Medicaid . [facility] - [address] Franne Forts, MD; [place], [place]; Canadian, NP; [place], [place] o [address] [place], [place], [place] [address] o [phone] o Mon-Fri 8:00-5:00 o Babies seen by providers at [facility] o Accepting [facility] [phone]) . [facility] [place], [place] o [address] [place], [place]  [address] o [phone] o Mon-Fri 8:00-5:00 o Babies seen by [facility] providers o Does NOT accept Medicaid o Limited availability, please call early in hospitalization to schedule follow-up . [facility] Jolee Ewing, PA; Eddie Candle, MD; [place], MD; [place], [place]; Constance Goltz, MD; [place], [place] o [facility] [address], [place], [place] [address] o [phone] o Mon-Fri 8:30-5:00, Sat 9:00-12:00 o Babies seen by providers at [facility] o Accepting Medicaid o Please register online then schedule online or call office o [email or web address] . [facility] at [facility]) Samuella Bruin, NP; Lucianne Muss, MD; Lanier Clam, PA o [address], [place], [place] [address] o [phone] o Mon-Fri 8:00-5:00 o Babies seen by providers at [facility] o Accepting Medicaid . [facility] ([facility]) Sharin Mons, MD; Reed Breech, NP; Shelva Majestic, MD o [address], [place], [place] [address] o [phone] o Mon-Fri 8:00-5:30, Sat&Sun by appointment (phones open at 8:30) o Babies seen by [facility] providers o Accepting Medicaid o Must be a first-time baby or sibling of current patient . [facility] [address], [place], [place]  [address] o [phone]   Fax - [phone]  [place] [phone] & [phone]) . [facility] o [place], [place]; [place], [place]; Dimple Casey, MD; [place], [place]; Carolyne Fiscal, MD o [address], [place], [place] [address] o [phone] o Mon-Thur 8:00-7:00, Fri 8:00-5:00, Sat 8:00-12:00, Sun 9:00-12:00 o Babies seen by [facility] providers o Accepting Medicaid . [facility] at [facility], MD; Gaynell Face, MD; [facility], MD o [address], [place], [place] [address] o [phone] o Mon-Thur 8:00-5:00 o Babies seen by providers at [facility] o Accepting  Medicaid . [facility] [place] Gwenlyn Saran, MD; Coe[place]Goins, MD; Madilyn Fireman, MD; Melvyn Neth, MD; List, MD; Lazarus Salines, MD; Gaynell Face, MD; Berneda Rose, MD; Flora Lipps, MD; Beryl Meager, MD; Luther Redo, MD; Lavonia Drafts, MD; Kellie Simmering, MD o [address] [place] [place], [place] [address] o [phone] o Mon-Fri 8:00-5:30, Sat (Oct.-Mar.) 9:00-1:00 o Babies seen by providers at [facility] o Accepting Medicaid o Must fill out new patient packet, available online at [email or web address] . [facility] [place] at [facility]) Simone Curia, NP; Tiburcio Pea, NP; Tresa Endo, NP; Whitney Post, MD; [place], [place]; Hennie Duos, MD; Wynne Dust, MD; Kavin Leech, NP o [address], [place], [place] [address] o [phone] o Mon-Thur 8:00-5:30, Fri 8:00-5:00 o Babies seen by providers at [facility] o Accepting [facility] [phone]) . [facility] o [place], [place]; [place], MD; Tanya Nones, MD; [place], [place] o [address] [place], [place] [address] o [phone] o Mon-Fri 8:00-5:00 o Babies seen by providers at [facility] o Accepting [facility]   [facility]  [place] [phone]) . [facility] o WolcottMasneri, DO; Lenise ArenaMeyers, MD; [place], [place] o [address], [place], [place] [address] o [phone] o Mon-Fri 8:00-5:00 o Babies seen by providers at [facility] o Does NOT accept Medicaid o Limited appointment availability, please call early in hospitalization  . Nature conservation officerLeBauer [facility] at [facility] o CaberfaeKunedd, DO; [place], MD o [address], [place], [place] [address] o [phone] o Mon-Fri 8:00-5:00 o Babies seen by [facility] providers o Does NOT accept Medicaid . [facility] Lorrine Kino Cameron, MD; Ninetta LightsMacDonald, MD; [place], [place]; [place] Island[place]ayak, MD o [address] [facility] [address], [place], [place] [address] o [phone] o Mon-Fri 8:00-5:00 o After hours clinic [facility]([address] [place], [place] [address])  [phone] Mon-Fri 5:00-8:00, Sat 12:00-6:00, Sun 10:00-4:00 o Babies seen by [facility] providers o Accepting Medicaid . [facility] o [address], [place], [place]  [address] o [phone]   Fax - [phone]  [place] [phone]) . Nature conservation officerLeBauer HealthCare at [facility] o Andy, MD o [address] [place], [place], [place] [address] o [phone] o Mon-Fri 8:00-5:00 o Babies seen by [facility] providers o Does NOT accept Medicaid . [facility] [place] [facility] [place]) Tomi Likenso Eksir, MD o [address], [place], [place] [address] o [phone] o Mon-Thur 8:00-7:00, Fri 8:00-5:00, Sat 8:00-12:00 o Babies seen by providers at [facility] o Accepting Medicaid - but does not have vaccinations in office (must be received elsewhere) o Limited availability, please call early in hospitalization  [place] ([phone]) . [facility]  o Wyvonne Lenzharlene Flemming, MD o [address], [place] [address] o [phone]  Fax [phone] Places to have your son circumcised:                                                                      [facility]     [phone]   [phone] while you are in hospital         [facility]              [phone]   $269 by 4 wks                      [facility]                     [phone]   $269 by 7 days [facility]                    [phone]   $269 by 4 wks [facility]             [phone]   $225 by 2 wks    These prices sometimes change but are roughly what you can expect to pay. Please call and confirm pricing.   Circumcision is considered an elective/non-medically necessary procedure. There are many reasons parents decide to have their sons circumsized. During the first year of life circumcised males have a reduced risk of urinary tract infections but after this year the rates between circumcised males and uncircumcised males are the same.  It is safe to have your son  circumcised outside  of the hospital and the places above perform them regularly.   Deciding about Circumcision in Baby Boys  (Up-to-date The Basics)  What is circumcision?   Circumcision is a surgery that removes the skin that covers the tip of the penis, called the "foreskin" Circumcision is usually done when a boy is between 4 and 77 days old. In the [place], circumcision is common. In some other countries, fewer boys are circumcised. Circumcision is a common tradition in some religions.  Should I have my baby boy circumcised?   There is no easy answer. Circumcision has some benefits. But it also has risks. After talking with your doctor, you will have to decide for yourself what is right for your family.  What are the benefits of circumcision?   Circumcised boys seem to have slightly lower rates of: ?Urinary tract infections ?Swelling of the opening at the tip of the penis Circumcised men seem to have slightly lower rates of: ?Urinary tract infections ?Swelling of the opening at the tip of the penis ?Penis cancer ?HIV and other infections that you catch during sex ?Cervical cancer in the women they have sex with Even so, in the [place], the risks of these problems are small - even in boys and men who have not been circumcised. Plus, boys and men who are not circumcised can reduce these extra risks by: ?Cleaning their penis well ?Using condoms during sex  What are the risks of circumcision?  Risks include: ?Bleeding or infection from the surgery ?Damage to or amputation of the penis ?A chance that the doctor will cut off too much or not enough of the foreskin ?A chance that sex won't feel as good later in life Only about 1 out of every 200 circumcisions leads to problems. There is also a chance that your health insurance won't pay for circumcision.  How is circumcision done in baby boys?  First, the baby gets medicine for pain relief. This might be a cream on the  skin or a shot into the base of the penis. Next, the doctor cleans the baby's penis well. Then he or she uses special tools to cut off the foreskin. Finally, the doctor wraps a bandage (called gauze) around the baby's penis. If you have your baby circumcised, his doctor or nurse will give you instructions on how to care for him after the surgery. It is important that you follow those instructions carefully.

## 2019-03-01 ENCOUNTER — Telehealth: Payer: Self-pay

## 2019-03-01 ENCOUNTER — Encounter: Payer: Self-pay | Admitting: *Deleted

## 2019-03-01 NOTE — Telephone Encounter (Signed)
Tried to call pt to advise that her BP Cuff arrived today, no answer & pt does not have VM set up yet.

## 2019-03-01 NOTE — Progress Notes (Unsigned)
Pts BP Cuff arrived today 03/01/19 from [facility], will notify pt to pick up.

## 2019-03-02 ENCOUNTER — Telehealth: Payer: Self-pay | Admitting: *Deleted

## 2019-03-02 LAB — OBSTETRIC PANEL, INCLUDING HIV
Antibody Screen: NEGATIVE
Basophils Absolute: 0 10*3/uL (ref 0.0–0.2)
Basos: 0 %
EOS (ABSOLUTE): 0.1 10*3/uL (ref 0.0–0.4)
Eos: 1 %
HIV Screen 4th Generation wRfx: NONREACTIVE
Hematocrit: 37.5 % (ref 34.0–46.6)
Hemoglobin: 12.2 g/dL (ref 11.1–15.9)
Hepatitis B Surface Ag: NEGATIVE
Immature Grans (Abs): 0 10*3/uL (ref 0.0–0.1)
Immature Granulocytes: 0 %
Lymphocytes Absolute: 2.1 10*3/uL (ref 0.7–3.1)
Lymphs: 30 %
MCH: 26.9 pg (ref 26.6–33.0)
MCHC: 32.5 g/dL (ref 31.5–35.7)
MCV: 83 fL (ref 79–97)
Monocytes Absolute: 0.5 10*3/uL (ref 0.1–0.9)
Monocytes: 7 %
Neutrophils Absolute: 4.4 10*3/uL (ref 1.4–7.0)
Neutrophils: 62 %
Platelets: 339 10*3/uL (ref 150–450)
RBC: 4.53 x10E6/uL (ref 3.77–5.28)
RDW: 18.9 % — ABNORMAL HIGH (ref 11.7–15.4)
RPR Ser Ql: NONREACTIVE
Rh Factor: POSITIVE
Rubella Antibodies, IGG: 1.13 index (ref >0.99)
WBC: 7.1 10*3/uL (ref 3.4–10.8)

## 2019-03-02 LAB — CYTOLOGY - PAP
Chlamydia: NEGATIVE
Comment: NEGATIVE
Comment: NORMAL
Diagnosis: NEGATIVE
Neisseria Gonorrhea: POSITIVE — AB

## 2019-03-02 LAB — HEMOGLOBIN A1C
Est. average glucose Bld gHb Est-mCnc: 94 mg/dL
Hgb A1c MFr Bld: 4.9 % (ref 4.8–5.6)

## 2019-03-02 LAB — URINE CULTURE, OB REFLEX

## 2019-03-02 LAB — CULTURE, OB URINE

## 2019-03-02 NOTE — Telephone Encounter (Signed)
Opened in error. Briona Korpela,RN 

## 2019-03-02 NOTE — Telephone Encounter (Addendum)
I called Avonelle at the number listed and a woman answered stating she is Not Ambriana and I have the wrong number.  I will send a MyChart message to [place]e to notify her. Layken Beg,RN Please see My chart message re: this from today. Joscelin Fray,RN STD form for Health department completed and sent.  Xayla Puzio,RN

## 2019-03-02 NOTE — Telephone Encounter (Signed)
-----   Message from Currie Paris, NP sent at 03/02/2019  4:29 PM EST ----- Call and schedule client to come in for treatment - Rocephin 500 mg IM.  Partner will need to go for treatment also.  No sex until 7 days after partner is treated.

## 2019-03-08 ENCOUNTER — Ambulatory Visit (INDEPENDENT_AMBULATORY_CARE_PROVIDER_SITE_OTHER): Payer: Medicaid Other

## 2019-03-08 ENCOUNTER — Other Ambulatory Visit: Payer: Self-pay

## 2019-03-08 VITALS — BP 117/69 | HR 82 | Wt 208.8 lb

## 2019-03-08 DIAGNOSIS — O98219 Gonorrhea complicating pregnancy, unspecified trimester: Secondary | ICD-10-CM

## 2019-03-08 DIAGNOSIS — Z3A13 13 weeks gestation of pregnancy: Secondary | ICD-10-CM | POA: Diagnosis not present

## 2019-03-08 DIAGNOSIS — O98211 Gonorrhea complicating pregnancy, first trimester: Secondary | ICD-10-CM

## 2019-03-08 MED ORDER — CEFTRIAXONE SODIUM 500 MG IJ SOLR
500.0000 mg | Freq: Once | INTRAMUSCULAR | Status: AC
  Administered 2019-03-08: 500 mg via INTRAMUSCULAR

## 2019-03-08 NOTE — Progress Notes (Signed)
Pt here today following positive test for Gonorrhea on 02/28/19 during pregnancy. Treatment explained to pt; Rocephin 500 mg reconstituted with 1 mL Lidocaine 1% and administered IM. Pt tolerated well. Explained partner needs to be treated and pt should not have intercourse for 2 weeks; no intercourse with current partner for 2 weeks after last person has been treated. Explained pt will have a test of cure swab done at regular OB visit in approx. 4 weeks.   Fleet Contras RN 03/08/19

## 2019-03-13 NOTE — Progress Notes (Signed)
Chart reviewed for nurse visit. Agree with plan of care.   Marylene Land, CNM 03/13/2019 7:41 AM

## 2019-03-21 ENCOUNTER — Other Ambulatory Visit: Payer: Self-pay | Admitting: Nurse Practitioner

## 2019-03-21 ENCOUNTER — Encounter: Payer: Self-pay | Admitting: *Deleted

## 2019-03-29 ENCOUNTER — Telehealth (INDEPENDENT_AMBULATORY_CARE_PROVIDER_SITE_OTHER): Payer: Medicaid Other | Admitting: Women's Health

## 2019-03-29 DIAGNOSIS — Z8759 Personal history of other complications of pregnancy, childbirth and the puerperium: Secondary | ICD-10-CM | POA: Insufficient documentation

## 2019-03-29 DIAGNOSIS — Z5329 Procedure and treatment not carried out because of patient's decision for other reasons: Secondary | ICD-10-CM

## 2019-03-29 MED ORDER — ASPIRIN EC 81 MG PO TBEC: 81.0000 mg | DELAYED_RELEASE_TABLET | Freq: Every day | ORAL | 5 refills | Status: DC

## 2019-03-29 NOTE — Progress Notes (Signed)
Called patients number was advised to stop calling because the number was not the patient  Called the mothers number on file and no answer.

## 2019-03-29 NOTE — Progress Notes (Signed)
Attempted to reach patient, patient phone number in chart is not for patient. Unable to reach patient for virtual visit.  Marylen Ponto, NP  11:45 AM 03/29/2019

## 2019-03-31 ENCOUNTER — Other Ambulatory Visit: Payer: Self-pay

## 2019-03-31 ENCOUNTER — Other Ambulatory Visit (HOSPITAL_COMMUNITY)
Admission: RE | Admit: 2019-03-31 | Discharge: 2019-03-31 | Disposition: A | Discharge status: Home / Self Care | Payer: Medicaid Other | Source: Ambulatory Visit | Attending: Obstetrics and Gynecology | Admitting: Obstetrics and Gynecology

## 2019-03-31 ENCOUNTER — Ambulatory Visit (INDEPENDENT_AMBULATORY_CARE_PROVIDER_SITE_OTHER): Payer: Medicaid Other | Admitting: Obstetrics and Gynecology

## 2019-03-31 VITALS — BP 120/68 | HR 89 | Wt 209.7 lb

## 2019-03-31 DIAGNOSIS — O0990 Supervision of high risk pregnancy, unspecified, unspecified trimester: Secondary | ICD-10-CM

## 2019-03-31 DIAGNOSIS — Z8759 Personal history of other complications of pregnancy, childbirth and the puerperium: Secondary | ICD-10-CM

## 2019-03-31 DIAGNOSIS — Z98891 History of uterine scar from previous surgery: Secondary | ICD-10-CM

## 2019-03-31 LAB — POCT URINALYSIS DIP (DEVICE)
Bilirubin Urine: NEGATIVE
Glucose, UA: NEGATIVE mg/dL
Hgb urine dipstick: NEGATIVE
Ketones, ur: NEGATIVE mg/dL
Leukocytes,Ua: NEGATIVE
Nitrite: NEGATIVE
Protein, ur: NEGATIVE mg/dL
Specific Gravity, Urine: 1.025 (ref 1.005–1.030)
Urobilinogen, UA: 0.2 mg/dL (ref 0.0–1.0)
pH: 6.5 (ref 5.0–8.0)

## 2019-03-31 MED ORDER — ASPIRIN EC 81 MG PO TBEC: 81.0000 mg | DELAYED_RELEASE_TABLET | Freq: Every day | ORAL | 1 refills | Status: AC

## 2019-03-31 NOTE — Progress Notes (Signed)
   PRENATAL VISIT NOTE  Subjective:  Sherri Clark is a 29 y.o. G2P0101 at [redacted]w[redacted]d being seen today for ongoing prenatal care.  She is currently monitored for the following issues for this low-risk pregnancy and has Supervision of high risk pregnancy, antepartum; History of gonorrhea; History of cesarean delivery; Morning sickness; BMI 29.0-29.9,adult; and History of gestational hypertension on their problem list.  Patient reports no complaints.  Contractions: Not present. Vag. Bleeding: None.   . Denies leaking of fluid.   The following portions of the patient's history were reviewed and updated as appropriate: allergies, current medications, past family history, past medical history, past social history, past surgical history and problem list.   Objective:   Vitals:   03/31/19 1121  BP: 120/68  Pulse: 89  Weight: 209 lb 11.2 oz (95.1 kg)    Fetal Status: Fetal Heart Rate (bpm): 141         General:  Alert, oriented and cooperative. Patient is in no acute distress.  Skin: Skin is warm and dry. No rash noted.   Cardiovascular: Normal heart rate noted  Respiratory: Normal respiratory effort, no problems with respiration noted  Abdomen: Soft, gravid, appropriate for gestational age.  Pain/Pressure: Present     Pelvic: Cervical exam deferred        Extremities: Normal range of motion.  Edema: None  Mental Status: Normal mood and affect. Normal behavior. Normal judgment and thought content.   Assessment and Plan:  Pregnancy: G2P0101 at [redacted]w[redacted]d  1. Supervision of high risk pregnancy, antepartum  - AFP, Serum, Open Spina Bifida  2. History of gestational hypertension  Daily BASA- pick up today.   3. History of cesarean delivery  Desires TOLAC- next visit with MD to discuss and sign consent.   Preterm labor symptoms and general obstetric precautions including but not limited to vaginal bleeding, contractions, leaking of fluid and fetal movement were reviewed in detail with the  patient. Please refer to After Visit Summary for other counseling recommendations.   Return in about 4 weeks (around 04/28/2019) for With MD to discuss and sign consent for TOLAC.  Future Appointments  Date Time Provider Department Center  04/18/2019  9:00 AM [facility] NURSE [facility]  04/18/2019  9:00 AM [facility] [place] 3 [facility]  05/04/2019  3:55 PM Constant, Gigi Gin, MD [facility]    Venia Carbon, NP

## 2019-03-31 NOTE — Patient Instructions (Signed)
Vaginal Birth After Cesarean Delivery  Vaginal birth after cesarean delivery (VBAC) is giving birth vaginally after previously delivering a baby through a cesarean section (C-section). A VBAC may be a safe option for you, depending on your health and other factors. It is important to discuss VBAC with your health care provider early in your pregnancy so you can understand the risks, benefits, and options. Having these discussions early will give you time to make your birth plan. Who are the best candidates for VBAC? The best candidates for VBAC are women who:  Have had one or two prior cesarean deliveries, and the incision made during the delivery was horizontal (low transverse).  Do not have a vertical (classical) scar on their uterus.  Have not had a tear in the wall of their uterus (uterine rupture).  Plan to have more pregnancies. A VBAC is also more likely to be successful:  In women who have previously given birth vaginally.  When labor starts by itself (spontaneously) before the due date. What are the benefits of VBAC? The benefits of delivering your baby vaginally instead of by a cesarean delivery include:  A shorter hospital stay.  A faster recovery time.  Less pain.  Avoiding risks associated with major surgery, such as infection and blood clots.  Less blood loss and less need for donated blood (transfusions). What are the risks of VBAC? The main risk of attempting a VBAC is that it may fail, forcing your health care provider to deliver your baby by a C-section. Other risks are rare and include:  Tearing (rupture) of the scar from a past cesarean delivery.  Other risks associated with vaginal deliveries. If a repeat cesarean delivery is needed, the risks include:  Blood loss.  Infection.  Blood clot.  Damage to surrounding organs.  Removal of the uterus (hysterectomy), if it is damaged.  Placenta problems in future pregnancies. What else should I know  about my options? Delivering a baby through a VBAC is similar to having a normal spontaneous vaginal delivery. Therefore, it is safe:  To try with twins.  For your health care provider to try to turn the baby from a breech position (external cephalic version) during labor.  With epidural analgesia for pain relief. Consider where you would like to deliver your baby. VBAC should be attempted in facilities where an emergency cesarean delivery can be performed. VBAC is not recommended for home births. Any changes in your health or your baby's health during your pregnancy may make it necessary to change your initial decision about VBAC. Your health care provider may recommend that you do not attempt a VBAC if:  Your baby's suspected weight is 8.8 lb (4 kg) or more.  You have preeclampsia. This is a condition that causes high blood pressure along with other symptoms, such as swelling and headaches.  You will have VBAC less than 19 months after your cesarean delivery.  You are past your due date.  You need to have labor started (induced) because your cervix is not ready for labor (unfavorable). Where to find more information  American Pregnancy Association: americanpregnancy.org  American Congress of Obstetricians and Gynecologists: acog.org Summary  Vaginal birth after cesarean delivery (VBAC) is giving birth vaginally after previously delivering a baby through a cesarean section (C-section). A VBAC may be a safe option for you, depending on your health and other factors.  Discuss VBAC with your health care provider early in your pregnancy so you can understand the risks, benefits, options, and   have plenty of time to make your birth plan.  The main risk of attempting a VBAC is that it may fail, forcing your health care provider to deliver your baby by a C-section. Other risks are rare. This information is not intended to replace advice given to you by your health care provider. Make sure  you discuss any questions you have with your health care provider. Document Revised: 04/27/2018 Document Reviewed: 04/08/2016 Elsevier Patient Education  2020 [employer]  

## 2019-04-01 LAB — GC/CHLAMYDIA PROBE AMP (~~LOC~~) NOT AT ARMC
Chlamydia: NEGATIVE
Comment: NEGATIVE
Comment: NORMAL
Neisseria Gonorrhea: NEGATIVE

## 2019-04-02 ENCOUNTER — Inpatient Hospital Stay (HOSPITAL_COMMUNITY): Payer: Medicaid Other

## 2019-04-02 ENCOUNTER — Other Ambulatory Visit: Payer: Self-pay

## 2019-04-02 ENCOUNTER — Inpatient Hospital Stay (HOSPITAL_COMMUNITY)
Admission: AD | Admit: 2019-04-02 | Discharge: 2019-04-02 | Disposition: A | Discharge status: Home / Self Care | Payer: Medicaid Other | Attending: Obstetrics & Gynecology | Admitting: Obstetrics & Gynecology

## 2019-04-02 ENCOUNTER — Encounter (HOSPITAL_COMMUNITY): Payer: Self-pay | Admitting: Obstetrics & Gynecology

## 2019-04-02 DIAGNOSIS — N949 Unspecified condition associated with female genital organs and menstrual cycle: Secondary | ICD-10-CM

## 2019-04-02 DIAGNOSIS — O26892 Other specified pregnancy related conditions, second trimester: Secondary | ICD-10-CM

## 2019-04-02 DIAGNOSIS — Z3A00 Weeks of gestation of pregnancy not specified: Secondary | ICD-10-CM | POA: Diagnosis not present

## 2019-04-02 DIAGNOSIS — Z87891 Personal history of nicotine dependence: Secondary | ICD-10-CM | POA: Insufficient documentation

## 2019-04-02 DIAGNOSIS — R1031 Right lower quadrant pain: Secondary | ICD-10-CM | POA: Diagnosis not present

## 2019-04-02 DIAGNOSIS — R102 Pelvic and perineal pain: Secondary | ICD-10-CM | POA: Diagnosis not present

## 2019-04-02 DIAGNOSIS — R55 Syncope and collapse: Secondary | ICD-10-CM | POA: Diagnosis not present

## 2019-04-02 DIAGNOSIS — O26891 Other specified pregnancy related conditions, first trimester: Secondary | ICD-10-CM | POA: Diagnosis not present

## 2019-04-02 DIAGNOSIS — R1084 Generalized abdominal pain: Secondary | ICD-10-CM | POA: Diagnosis not present

## 2019-04-02 DIAGNOSIS — Z3A16 16 weeks gestation of pregnancy: Secondary | ICD-10-CM | POA: Insufficient documentation

## 2019-04-02 DIAGNOSIS — R52 Pain, unspecified: Secondary | ICD-10-CM | POA: Diagnosis not present

## 2019-04-02 DIAGNOSIS — R1111 Vomiting without nausea: Secondary | ICD-10-CM | POA: Diagnosis not present

## 2019-04-02 DIAGNOSIS — I10 Essential (primary) hypertension: Secondary | ICD-10-CM | POA: Diagnosis not present

## 2019-04-02 LAB — COMPREHENSIVE METABOLIC PANEL
ALT: 20 U/L (ref 0–44)
AST: 19 U/L (ref 15–41)
Albumin: 3 g/dL — ABNORMAL LOW (ref 3.5–5.0)
Alkaline Phosphatase: 58 U/L (ref 38–126)
Anion gap: 10 (ref 5–15)
BUN: 8 mg/dL (ref 6–20)
CO2: 21 mmol/L — ABNORMAL LOW (ref 22–32)
Calcium: 8.7 mg/dL — ABNORMAL LOW (ref 8.9–10.3)
Chloride: 105 mmol/L (ref 98–111)
Creatinine, Ser: 0.48 mg/dL (ref 0.44–1.00)
GFR calc Af Amer: >60 mL/min (ref >60)
GFR calc non Af Amer: >60 mL/min (ref >60)
Glucose, Bld: 98 mg/dL (ref 70–99)
Potassium: 3.7 mmol/L (ref 3.5–5.1)
Sodium: 136 mmol/L (ref 135–145)
Total Bilirubin: 0.5 mg/dL (ref 0.3–1.2)
Total Protein: 6.2 g/dL — ABNORMAL LOW (ref 6.5–8.1)

## 2019-04-02 LAB — WET PREP, GENITAL
Sperm: NONE SEEN
Trich, Wet Prep: NONE SEEN
Yeast Wet Prep HPF POC: NONE SEEN

## 2019-04-02 LAB — AFP, SERUM, OPEN SPINA BIFIDA
AFP MoM: 0.9
AFP Value: 29 ng/mL
Gest. Age on Collection Date: 16.6 weeks
Maternal Age At EDD: 27.1 yr
OSBR Risk 1 IN: 10000
Test Results:: NEGATIVE
Weight: 209 [lb_av]

## 2019-04-02 LAB — CBC WITH DIFFERENTIAL/PLATELET
Abs Immature Granulocytes: 0.04 10*3/uL (ref 0.00–0.07)
Basophils Absolute: 0 10*3/uL (ref 0.0–0.1)
Basophils Relative: 0 %
Eosinophils Absolute: 0 10*3/uL (ref 0.0–0.5)
Eosinophils Relative: 0 %
HCT: 38.3 % (ref 36.0–46.0)
Hemoglobin: 12.6 g/dL (ref 12.0–15.0)
Immature Granulocytes: 0 %
Lymphocytes Relative: 20 %
Lymphs Abs: 2.3 10*3/uL (ref 0.7–4.0)
MCH: 28.8 pg (ref 26.0–34.0)
MCHC: 32.9 g/dL (ref 30.0–36.0)
MCV: 87.4 fL (ref 80.0–100.0)
Monocytes Absolute: 0.6 10*3/uL (ref 0.1–1.0)
Monocytes Relative: 6 %
Neutro Abs: 8.1 10*3/uL — ABNORMAL HIGH (ref 1.7–7.7)
Neutrophils Relative %: 74 %
Platelets: 281 10*3/uL (ref 150–400)
RBC: 4.38 MIL/uL (ref 3.87–5.11)
RDW: 16.8 % — ABNORMAL HIGH (ref 11.5–15.5)
WBC: 11.1 10*3/uL — ABNORMAL HIGH (ref 4.0–10.5)
nRBC: 0 % (ref 0.0–0.2)

## 2019-04-02 LAB — URINALYSIS, ROUTINE W REFLEX MICROSCOPIC
Bilirubin Urine: NEGATIVE
Glucose, UA: NEGATIVE mg/dL
Hgb urine dipstick: NEGATIVE
Ketones, ur: NEGATIVE mg/dL
Leukocytes,Ua: NEGATIVE
Nitrite: NEGATIVE
Protein, ur: NEGATIVE mg/dL
Specific Gravity, Urine: 1.012 (ref 1.005–1.030)
pH: 7 (ref 5.0–8.0)

## 2019-04-02 MED ORDER — CYCLOBENZAPRINE HCL 5 MG PO TABS
10.0000 mg | ORAL_TABLET | Freq: Once | ORAL | Status: AC
  Administered 2019-04-02: 10 mg via ORAL
  Filled 2019-04-02: qty 2

## 2019-04-02 MED ORDER — CYCLOBENZAPRINE HCL 10 MG PO TABS: 10.0000 mg | ORAL_TABLET | Freq: Three times a day (TID) | ORAL | 0 refills | Status: DC | PRN

## 2019-04-02 MED ORDER — COMFORT FIT MATERNITY SUPP SM MISC: 1.0000 [IU] | Freq: Every day | 0 refills | Status: DC | PRN

## 2019-04-02 MED ORDER — ACETAMINOPHEN 500 MG PO TABS
1000.0000 mg | ORAL_TABLET | Freq: Once | ORAL | Status: AC
  Administered 2019-04-02: 1000 mg via ORAL
  Filled 2019-04-02: qty 2

## 2019-04-02 NOTE — MAU Note (Signed)
Pt reports to MAU via EMS c/o a LRQ pain that started about a hour ago. Pt states the pain is a tearing like pain that is a 10/10. Pt reports also having a pain in her vagina that feels like someone is pulling something out. Pt reports feeling constipated over the last few days but had some BM this morning. Pt denies bleeding or LOF. +FM. FHR 150.

## 2019-04-02 NOTE — Discharge Instructions (Signed)
PREGNANCY SUPPORT BELT: You are not alone, Seventy-five percent of women have some sort of abdominal or back pain at some point in their pregnancy. Your baby is growing at a fast pace, which means that your whole body is rapidly trying to adjust to the changes. As your uterus grows, your back may start feeling a bit under stress and this can result in back or abdominal pain that can go from mild, and therefore bearable, to severe pains that will not allow you to sit or lay down comfortably, When it comes to dealing with pregnancy-related pains and cramps, some pregnant women usually prefer natural remedies, which the market is filled with nowadays. For example, wearing a pregnancy support belt can help ease and lessen your discomfort and pain. WHAT ARE THE BENEFITS OF WEARING A PREGNANCY SUPPORT BELT? A pregnancy support belt provides support to the lower portion of the belly taking some of the weight of the growing uterus and distributing to the other parts of your body. It is designed make you comfortable and gives you extra support. Over the years, the pregnancy apparel market has been studying the needs and wants of pregnant women and they have come up with the most comfortable pregnancy support belts that woman could ever ask for. In fact, you will no longer have to wear a stretched-out or bulky pregnancy belt that is visible underneath your clothes and makes you feel even more uncomfortable. Nowadays, a pregnancy support belt is made of comfortable and stretchy materials that will not irritate your skin but will actually make you feel at ease and you will not even notice you are wearing it. They are easy to put on and adjust during the day and can be worn at night for additional support.  BENEFITS:  Relives Back pain  Relieves Abdominal Muscle and Leg Pain  Stabilizes the Pelvic Ring  Offers a Cushioned Abdominal Lift Pad  Relieves pressure on the Sciatic Nerve Within Minutes WHERE TO GET  YOUR PREGNANCY BELT: [employer] [phone] [phone]  [place], [place] [address]                     Safe Medications in Pregnancy    Acne: Benzoyl Peroxide Salicylic Acid  Backache/Headache: Tylenol: 2 regular strength every 4 hours OR              2 Extra strength every 6 hours  Colds/Coughs/Allergies: Benadryl (alcohol free) 25 mg every 6 hours as needed Breath right strips Claritin Cepacol throat lozenges Chloraseptic throat spray Cold-Eeze- up to three times per day Cough drops, alcohol free Flonase (by prescription only) Guaifenesin Mucinex Robitussin DM (plain only, alcohol free) Saline nasal spray/drops Sudafed (pseudoephedrine) & Actifed ** use only after [redacted] weeks gestation and if you do not have high blood pressure Tylenol Vicks Vaporub Zinc lozenges Zyrtec   Constipation: Colace Ducolax suppositories Fleet enema Glycerin suppositories Metamucil Milk of magnesia Miralax Senokot Smooth move tea  Diarrhea: Kaopectate Imodium A-D  *NO pepto Bismol  Hemorrhoids: Anusol Anusol HC Preparation H Tucks  Indigestion: Tums Maalox Mylanta Zantac  Pepcid  Insomnia: Benadryl (alcohol free) 25mg  every 6 hours as needed Tylenol PM Unisom, no Gelcaps  Leg Cramps: Tums MagGel  Nausea/Vomiting:  Bonine Dramamine Emetrol Ginger extract Sea bands Meclizine  Nausea medication to take during pregnancy:  Unisom (doxylamine succinate 25 mg tablets) Take one tablet daily at bedtime. If symptoms are not adequately controlled, the dose can be increased to  a maximum recommended dose of two tablets daily (1/2 tablet in the morning, 1/2 tablet mid-afternoon and one at bedtime). Vitamin B6 100mg  tablets. Take one tablet twice a day (up to 200 mg per day).  Skin Rashes: Aveeno products Benadryl cream or 25mg  every 6 hours as needed Calamine Lotion 1% cortisone cream  Yeast infection: Gyne-lotrimin 7 Monistat 7   **If  taking multiple medications, please check labels to avoid duplicating the same active ingredients **take medication as directed on the label ** Do not exceed 4000 mg of tylenol in 24 hours **Do not take medications that contain aspirin or ibuprofen      Appendicitis, Adult  Appendicitis is inflammation of the appendix. The appendix is a finger-shaped tube that is attached to the large intestine. If appendicitis is not treated, it can cause the appendix to tear (rupture). A ruptured appendix can lead to a life-threatening infection. It can also cause a painful collection of pus (abscess) to form in the appendix. What are the causes? This condition may be caused by a blockage in the appendix that leads to infection. The blockage can be caused by:  A ball of stool (feces).  Enlarged lymph glands. In some cases, the cause may not be known. What increases the risk? Age is a risk factor. You are more likely to develop this condition if you are between 27 and 75 years of age. What are the signs or symptoms? Symptoms of this condition include:  Pain that starts around the belly button and moves toward the lower right part of the abdomen. The pain can become more severe as time passes. It gets worse with coughing or sudden movements.  Tenderness in the lower right abdomen.  Nausea.  Vomiting.  Loss of appetite.  Fever.  Difficulty passing stool (constipation).  Passing very loose stools (diarrhea).  Generally feeling unwell. How is this diagnosed? This condition may be diagnosed with:  A physical exam.  Blood tests.  Urine test. To confirm the diagnosis, an ultrasound, MRI, or CT scan may be done. How is this treated? This condition is usually treated with surgery to remove the appendix (appendectomy). There are two methods for doing an appendectomy:  Open appendectomy. In this surgery, the appendix is removed through a large incision that is made in the lower right  abdomen. This procedure may be recommended if: ? You have major scarring from a previous surgery. ? You have a bleeding disorder. ? You are pregnant and are about to give birth. ? You have a condition that makes it hard to do surgery through small incisions (laparoscopic procedure). This includes severe infection or a ruptured appendix.  Laparoscopic appendectomy. In this surgery, the appendix is removed through small incisions. This procedure usually causes less pain and fewer problems than an open appendectomy. It also has a shorter recovery time. If the appendix has ruptured and an abscess has formed:  A drain may be placed into the abscess to remove fluid.  Antibiotic medicines may be given through an IV.  The appendix may or may not need to be removed. Follow these instructions at home: If you had surgery, follow instructions from your health care provider about how to care for yourself at home and how to care for your incision. Medicines  Take over-the-counter and prescription medicines only as told by your health care provider.  If you were prescribed an antibiotic medicine, take it as told by your health care provider. Do not stop taking the antibiotic even  if you start to feel better. Eating and drinking  Follow instructions from your health care provider about eating restrictions. You may slowly resume a regular diet once your nausea or vomiting stops. General instructions  Do not use any products that contain nicotine or tobacco, such as cigarettes, e-cigarettes, and chewing tobacco. If you need help quitting, ask your health care provider.  Do not drive or use heavy machinery while taking prescription pain medicine.  Ask your health care provider if the medicine prescribed to you can cause constipation. You may need to take steps to prevent or treat constipation, such as: ? Drink enough fluid to keep your urine pale yellow. ? Take over-the-counter or prescription  medicines. ? Eat foods that are high in fiber, such as beans, whole grains, and fresh fruits and vegetables. ? Limit foods that are high in fat and processed sugars, such as fried or sweet foods.  Keep all follow-up visits as told by your health care provider. This is important. Contact a health care provider if:  There is pus, blood, or excessive drainage coming from your incision.  You have nausea or vomiting. Get help right away if you have:  Worsening abdominal pain.  A fever.  Chills.  Fatigue.  Muscle aches.  Shortness of breath. Summary  Appendicitis is inflammation of the appendix.  This condition may be caused by a blockage in the appendix that leads to infection.  This condition is usually treated with surgery to remove the appendix. This information is not intended to replace advice given to you by your health care provider. Make sure you discuss any questions you have with your health care provider. Document Revised: 06/17/2017 Document Reviewed: 06/17/2017 Elsevier Patient Education  2020 [employer]   Abdominal Pain During Pregnancy  Abdominal pain is common during pregnancy, and has many possible causes. Some causes are more serious than others, and sometimes the cause is not known. Abdominal pain can be a sign that labor is starting. It can also be caused by normal growth and stretching of muscles and ligaments during pregnancy. Always tell your health care provider if you have any abdominal pain. Follow these instructions at home:  Do not have sex or put anything in your vagina until your pain goes away completely.  Get plenty of rest until your pain improves.  Drink enough fluid to keep your urine pale yellow.  Take over-the-counter and prescription medicines only as told by your health care provider.  Keep all follow-up visits as told by your health care provider. This is important. Contact a health care provider if:  Your pain continues or  gets worse after resting.  You have lower abdominal pain that: ? Comes and goes at regular intervals. ? Spreads to your back. ? Is similar to menstrual cramps.  You have pain or burning when you urinate. Get help right away if:  You have a fever or chills.  You have vaginal bleeding.  You are leaking fluid from your vagina.  You are passing tissue from your vagina.  You have vomiting or diarrhea that lasts for more than 24 hours.  Your baby is moving less than usual.  You feel very weak or faint.  You have shortness of breath.  You develop severe pain in your upper abdomen. Summary  Abdominal pain is common during pregnancy, and has many possible causes.  If you experience abdominal pain during pregnancy, tell your health care provider right away.  Follow your health care provider's home care  instructions and keep all follow-up visits as directed. This information is not intended to replace advice given to you by your health care provider. Make sure you discuss any questions you have with your health care provider. Document Revised: 04/19/2018 Document Reviewed: 04/03/2016 Elsevier Patient Education  2020 [employer].    Preterm Labor and Birth Information  The normal length of a pregnancy is 39-41 weeks. Preterm labor is when labor starts before 37 completed weeks of pregnancy. What are the risk factors for preterm labor? Preterm labor is more likely to occur in women who:  Have certain infections during pregnancy such as a bladder infection, sexually transmitted infection, or infection inside the uterus (chorioamnionitis).  Have a shorter-than-normal cervix.  Have gone into preterm labor before.  Have had surgery on their cervix.  Are younger than age 47 or older than age 47.  Are African American.  Are pregnant with twins or multiple babies (multiple gestation).  Take street drugs or smoke while pregnant.  Do not gain enough weight while  pregnant.  Became pregnant shortly after having been pregnant. What are the symptoms of preterm labor? Symptoms of preterm labor include:  Cramps similar to those that can happen during a menstrual period. The cramps may happen with diarrhea.  Pain in the abdomen or lower back.  Regular uterine contractions that may feel like tightening of the abdomen.  A feeling of increased pressure in the pelvis.  Increased watery or bloody mucus discharge from the vagina.  Water breaking (ruptured amniotic sac). Why is it important to recognize signs of preterm labor? It is important to recognize signs of preterm labor because babies who are born prematurely may not be fully developed. This can put them at an increased risk for:  Long-term (chronic) heart and lung problems.  Difficulty immediately after birth with regulating body systems, including blood sugar, body temperature, heart rate, and breathing rate.  Bleeding in the brain.  Cerebral palsy.  Learning difficulties.  Death. These risks are highest for babies who are born before 34 weeks of pregnancy. How is preterm labor treated? Treatment depends on the length of your pregnancy, your condition, and the health of your baby. It may involve:  Having a stitch (suture) placed in your cervix to prevent your cervix from opening too early (cerclage).  Taking or being given medicines, such as: ? Hormone medicines. These may be given early in pregnancy to help support the pregnancy. ? Medicine to stop contractions. ? Medicines to help mature the babys lungs. These may be prescribed if the risk of delivery is high. ? Medicines to prevent your baby from developing cerebral palsy. If the labor happens before 34 weeks of pregnancy, you may need to stay in the hospital. What should I do if I think I am in preterm labor? If you think that you are going into preterm labor, call your health care provider right away. How can I prevent preterm  labor in future pregnancies? To increase your chance of having a full-term pregnancy:  Do not use any tobacco products, such as cigarettes, chewing tobacco, and e-cigarettes. If you need help quitting, ask your health care provider.  Do not use street drugs or medicines that have not been prescribed to you during your pregnancy.  Talk with your health care provider before taking any herbal supplements, even if you have been taking them regularly.  Make sure you gain a healthy amount of weight during your pregnancy.  Watch for infection. If you think  that you might have an infection, get it checked right away.  Make sure to tell your health care provider if you have gone into preterm labor before. This information is not intended to replace advice given to you by your health care provider. Make sure you discuss any questions you have with your health care provider. Document Revised: 04/23/2018 Document Reviewed: 05/23/2015 Elsevier Patient Education  2020 Else[employer] Inc.    Round Ligament Pain  The round ligament is a cord of muscle and tissue that helps support the uterus. It can become a source of pain during pregnancy if it becomes stretched or twisted as the baby grows. The pain usually begins in the second trimester (13-28 weeks) of pregnancy, and it can come and go until the baby is delivered. It is not a serious problem, and it does not cause harm to the baby. Round ligament pain is usually a short, sharp, and pinching pain, but it can also be a dull, lingering, and aching pain. The pain is felt in the lower side of the abdomen or in the groin. It usually starts deep in the groin and moves up to the outside of the hip area. The pain may occur when you:  Suddenly change position, such as quickly going from a sitting to standing position.  Roll over in bed.  Cough or sneeze.  Do physical activity. Follow these instructions at home:   Watch your condition for any changes.  When  the pain starts, relax. Then try any of these methods to help with the pain: ? Sitting down. ? Flexing your knees up to your abdomen. ? Lying on your side with one pillow under your abdomen and another pillow between your legs. ? Sitting in a warm bath for 15-20 minutes or until the pain goes away.  Take over-the-counter and prescription medicines only as told by your health care provider.  Move slowly when you sit down or stand up.  Avoid long walks if they cause pain.  Stop or reduce your physical activities if they cause pain.  Keep all follow-up visits as told by your health care provider. This is important. Contact a health care provider if:  Your pain does not go away with treatment.  You feel pain in your back that you did not have before.  Your medicine is not helping. Get help right away if:  You have a fever or chills.  You develop uterine contractions.  You have vaginal bleeding.  You have nausea or vomiting.  You have diarrhea.  You have pain when you urinate. Summary  Round ligament pain is felt in the lower abdomen or groin. It is usually a short, sharp, and pinching pain. It can also be a dull, lingering, and aching pain.  This pain usually begins in the second trimester (13-28 weeks). It occurs because the uterus is stretching with the growing baby, and it is not harmful to the baby.  You may notice the pain when you suddenly change position, when you cough or sneeze, or during physical activity.  Relaxing, flexing your knees to your abdomen, lying on one side, or taking a warm bath may help to get rid of the pain.  Get help from your health care provider if the pain does not go away or if you have vaginal bleeding, nausea, vomiting, diarrhea, or painful urination. This information is not intended to replace advice given to you by your health care provider. Make sure you discuss any questions you have with  your health care provider. Document Revised:  06/17/2017 Document Reviewed: 06/17/2017 Elsevier Patient Education  2020 [employer]

## 2019-04-02 NOTE — MAU Provider Note (Signed)
History     CSN: [id number]  Arrival date and time: 04/02/19 0121   First Provider Initiated Contact with Patient 04/02/19 0136      Chief Complaint  Patient presents with  . Abdominal Pain   [name] is a 29 y.o. G2P0101 at 102w6dwho presents to MAU via EMS for RLQ pain. Pt reports she was driving when the pain started and it was mild at first, but then as soon as she stood up to get out of the car, the pain got significantly worse.  Onset: about one hour ago Location: RLQ along the groin Duration: one hour Character: uncomfortable, sharp, stabbing, pulling out of vagina, radiating in to right leg Aggravating/Associated: moving/none Relieving: rest Treatment: none Severity: 10/10  Pt denies VB, LOF, vaginal discharge/odor/itching. Pt denies N/V, constipation, diarrhea, or urinary problems. Pt denies fever, chills, fatigue, sweating or changes in appetite. Pt denies SOB or chest pain. Pt denies dizziness, HA, light-headedness, weakness.  Problems this pregnancy include: hx gHTN, hx C/S, hx of gonorrhea. Allergies? NKDA Current medications/supplements? PNVs Prenatal care provider? [facility], next appt 04/18/2019   OB History    Gravida  2   Para  1   Term      Preterm  1   AB      Living  1     SAB      TAB      Ectopic      Multiple      Live Births  1           Past Medical History:  Diagnosis Date  . Gonorrhea 2013    Past Surgical History:  Procedure Laterality Date  . CESAREAN SECTION    . CHOLECYSTECTOMY    . EYE SURGERY      Family History  Problem Relation Age of Onset  . Lupus Mother   . Hypertension Father     Social History   Tobacco Use  . Smoking status: Former Smoker    Types: Cigarettes    Quit date: 01/30/2017    Years since quitting: 2.1  . Smokeless tobacco: Never Used  Substance Use Topics  . Alcohol use: Never  . Drug use: Never    Allergies: No Known Allergies  Medications Prior to  Admission  Medication Sig Dispense Refill Last Dose  . Prenatal Vit-Fe Fumarate-FA (PREPLUS) 27-1 MG TABS Take 1 tablet by mouth daily. 30 tablet 6 04/01/2019 at Unknown time  . acetaminophen (TYLENOL) 500 MG tablet Take 500 mg by mouth every 6 (six) hours as needed for mild pain or moderate pain.   Unknown at Unknown time  . aspirin EC 81 MG tablet Take 1 tablet (81 mg total) by mouth daily. 90 tablet 1 Unknown at Unknown time  . Blood Pressure Monitoring (BLOOD PRESSURE KIT) DEVI 1 Device by Does not apply route as needed. 1 each 0 Unknown at Unknown time  . Doxylamine-Pyridoxine (DICLEGIS) 10-10 MG TBEC Take 2 tablets at bedtime and one in the morning and one in the afternoon as needed for nausea. (Patient not taking: Reported on 03/31/2019) 60 tablet 2     Review of Systems  Constitutional: Negative for chills, diaphoresis, fatigue and fever.  Eyes: Negative for visual disturbance.  Respiratory: Negative for shortness of breath.   Cardiovascular: Negative for chest pain.  Gastrointestinal: Positive for abdominal pain. Negative for constipation, diarrhea, nausea and vomiting.  Genitourinary: Negative for dysuria, flank pain, frequency, pelvic pain, urgency, vaginal bleeding and vaginal  discharge.  Neurological: Negative for dizziness, weakness, light-headedness and headaches.   Physical Exam   Blood pressure (!) 96/46, pulse 76, temperature 98 F (36.7 C), temperature source Oral, resp. rate 16, last menstrual period 11/28/2018, SpO2 97 %, unknown if currently breastfeeding.  Patient Vitals for the past 24 hrs:  BP Temp Temp src Pulse Resp SpO2  04/02/19 0631 (!) 96/46 98 F (36.7 C) Oral 76 16 97 %  04/02/19 0300 -- 98.7 F (37.1 C) Oral -- -- --  04/02/19 0129 108/65 -- Oral 93 (!) 21 --   Physical Exam  Constitutional: She is oriented to person, place, and time. She appears well-nourished. She appears distressed.  HENT:  Head: Normocephalic and atraumatic.  Respiratory: Effort  normal.  GI: Soft. There is abdominal tenderness in the right lower quadrant. There is rebound and guarding. There is no rigidity.  Neurological: She is alert and oriented to person, place, and time.  Skin: Skin is warm and dry. She is not diaphoretic.  Psychiatric: She has a normal mood and affect. Her behavior is normal. Judgment and thought content normal.  FHR 145  Results for orders placed or performed during the hospital encounter of 04/02/19 (from the past 24 hour(s))  Urinalysis, Routine w reflex microscopic     Status: Abnormal   Collection Time: 04/02/19  1:37 AM  Result Value Ref Range   Color, Urine YELLOW YELLOW   APPearance HAZY (A) CLEAR   Specific Gravity, Urine 1.012 1.005 - 1.030   pH 7.0 5.0 - 8.0   Glucose, UA NEGATIVE NEGATIVE mg/dL   Hgb urine dipstick NEGATIVE NEGATIVE   Bilirubin Urine NEGATIVE NEGATIVE   Ketones, ur NEGATIVE NEGATIVE mg/dL   Protein, ur NEGATIVE NEGATIVE mg/dL   Nitrite NEGATIVE NEGATIVE   Leukocytes,Ua NEGATIVE NEGATIVE  CBC with Differential/Platelet     Status: Abnormal   Collection Time: 04/02/19  2:39 AM  Result Value Ref Range   WBC 11.1 (H) 4.0 - 10.5 K/uL   RBC 4.38 3.87 - 5.11 MIL/uL   Hemoglobin 12.6 12.0 - 15.0 g/dL   HCT 38.3 36.0 - 46.0 %   MCV 87.4 80.0 - 100.0 fL   MCH 28.8 26.0 - 34.0 pg   MCHC 32.9 30.0 - 36.0 g/dL   RDW 16.8 (H) 11.5 - 15.5 %   Platelets 281 150 - 400 K/uL   nRBC 0.0 0.0 - 0.2 %   Neutrophils Relative % 74 %   Neutro Abs 8.1 (H) 1.7 - 7.7 K/uL   Lymphocytes Relative 20 %   Lymphs Abs 2.3 0.7 - 4.0 K/uL   Monocytes Relative 6 %   Monocytes Absolute 0.6 0.1 - 1.0 K/uL   Eosinophils Relative 0 %   Eosinophils Absolute 0.0 0.0 - 0.5 K/uL   Basophils Relative 0 %   Basophils Absolute 0.0 0.0 - 0.1 K/uL   Immature Granulocytes 0 %   Abs Immature Granulocytes 0.04 0.00 - 0.07 K/uL  Comprehensive metabolic panel     Status: Abnormal   Collection Time: 04/02/19  2:39 AM  Result Value Ref Range    Sodium 136 135 - 145 mmol/L   Potassium 3.7 3.5 - 5.1 mmol/L   Chloride 105 98 - 111 mmol/L   CO2 21 (L) 22 - 32 mmol/L   Glucose, Bld 98 70 - 99 mg/dL   BUN 8 6 - 20 mg/dL   Creatinine, Ser 0.48 0.44 - 1.00 mg/dL   Calcium 8.7 (L) 8.9 - 10.3 mg/dL  Total Protein 6.2 (L) 6.5 - 8.1 g/dL   Albumin 3.0 (L) 3.5 - 5.0 g/dL   AST 19 15 - 41 U/L   ALT 20 0 - 44 U/L   Alkaline Phosphatase 58 38 - 126 U/L   Total Bilirubin 0.5 0.3 - 1.2 mg/dL   GFR calc non Af Amer >60 >60 mL/min   GFR calc Af Amer >60 >60 mL/min   Anion gap 10 5 - 15  Wet prep, genital     Status: Abnormal   Collection Time: 04/02/19  2:56 AM  Result Value Ref Range   Yeast Wet Prep HPF POC NONE SEEN NONE SEEN   Trich, Wet Prep NONE SEEN NONE SEEN   Clue Cells Wet Prep HPF POC PRESENT (A) NONE SEEN   WBC, Wet Prep HPF POC FEW (A) NONE SEEN   Sperm NONE SEEN    MR PELVIS WO CONTRAST  Result Date: 04/02/2019 CLINICAL DATA:  Right lower quadrant pain during pregnancy EXAM: MRI ABDOMEN AND PELVIS WITHOUT CONTRAST TECHNIQUE: Multiplanar multisequence MR imaging of the abdomen and pelvis was performed. No intravenous contrast was administered. COMPARISON:  None. FINDINGS: COMBINED FINDINGS FOR BOTH MR ABDOMEN AND PELVIS Lower chest: Negative Hepatobiliary: No mass or other parenchymal abnormality identified. No biliary dilatation. Gallbladder surgically absent. Pancreas: No mass, inflammatory changes, or other parenchymal abnormality identified. Spleen:  Within normal limits in size and appearance. Adrenals/Urinary Tract: No masses identified. No evidence of hydronephrosis. Stomach/Bowel: Visualized portions within the abdomen are unremarkable. The appendix is normal. Vascular/Lymphatic:  Normal flow voids. Reproductive: Gravid uterus with single intrauterine pregnancy. Other:  None Musculoskeletal: Normal marrow signal. IMPRESSION: 1. Normal appendix. 2. Gravid uterus with single intrauterine pregnancy. Electronically Signed   By:  Ulyses Jarred M.D.   On: 04/02/2019 05:54   MR ABDOMEN WO CONTRAST  Result Date: 04/02/2019 CLINICAL DATA:  Right lower quadrant pain during pregnancy EXAM: MRI ABDOMEN AND PELVIS WITHOUT CONTRAST TECHNIQUE: Multiplanar multisequence MR imaging of the abdomen and pelvis was performed. No intravenous contrast was administered. COMPARISON:  None. FINDINGS: COMBINED FINDINGS FOR BOTH MR ABDOMEN AND PELVIS Lower chest: Negative Hepatobiliary: No mass or other parenchymal abnormality identified. No biliary dilatation. Gallbladder surgically absent. Pancreas: No mass, inflammatory changes, or other parenchymal abnormality identified. Spleen:  Within normal limits in size and appearance. Adrenals/Urinary Tract: No masses identified. No evidence of hydronephrosis. Stomach/Bowel: Visualized portions within the abdomen are unremarkable. The appendix is normal. Vascular/Lymphatic:  Normal flow voids. Reproductive: Gravid uterus with single intrauterine pregnancy. Other:  None Musculoskeletal: Normal marrow signal. IMPRESSION: 1. Normal appendix. 2. Gravid uterus with single intrauterine pregnancy. Electronically Signed   By: Ulyses Jarred M.D.   On: 04/02/2019 05:54    MAU Course  Procedures  MDM -RLQ pain, suspect RLP, r/o appendicitis -UA: hazy, otherwise WNL -CBC w/ Diff: WNL for pregnancy, WBCs increased compared to one month ago -CMP: WNL -WetPrep: +ClueCells (isolated finding not requiring treatment) -GC/CT collected -CE: long/closed/posterior -Tylenol '1000mg'$  and Flexeril '10mg'$  given, pt reports pain now 6/10 -even after reduction of pain, patient guarding abdomen -on exam, rebound tenderness elicited, significant tenderness in RLQ, deep palpation unable to be attained d/t pain -temp 98.7, WBCs 11.1, pt reports nausea prior to coming to MAU, but none since being in MAU -MRI: normal appendix -after MRI, pt sleeping soundly in room, pt reports pain still 6/10 -on repeat exam, rebound tenderness not  present, RLQ still tender to palpation, but appears less so -per consultation with Dr. Hulan Fray, pt  OK to be discharged home with precautions to return if symptoms worsen or new symptoms emerge, suggestive of appendicitis -pt discharged to home in stable condition  Orders Placed This Encounter  Procedures  . Wet prep, genital    Standing Status:   Standing    Number of Occurrences:   1  . MR ABDOMEN WO CONTRAST    Standing Status:   Standing    Number of Occurrences:   1    Order Specific Question:   What is the patient's sedation requirement?    Answer:   No Sedation    Order Specific Question:   Does the patient have a pacemaker or implanted devices?    Answer:   No    Order Specific Question:   Radiology Contrast Protocol - do NOT remove file path    Answer:   \\[email or web address]  . MR PELVIS WO CONTRAST    r/o appendicitis    Standing Status:   Standing    Number of Occurrences:   1    Order Specific Question:   What is the patient's sedation requirement?    Answer:   No Sedation    Order Specific Question:   Does the patient have a pacemaker or implanted devices?    Answer:   No    Order Specific Question:   Radiology Contrast Protocol - do NOT remove file path    Answer:   \\[email or web address]  . Urinalysis, Routine w reflex microscopic    Standing Status:   Standing    Number of Occurrences:   1  . CBC with Differential/Platelet    Standing Status:   Standing    Number of Occurrences:   1  . Comprehensive metabolic panel    Standing Status:   Standing    Number of Occurrences:   1  . Discharge patient    Order Specific Question:   Discharge disposition    Answer:   01-Home or Self Care [1]    Order Specific Question:   Discharge patient date    Answer:   04/02/2019   Meds ordered this encounter  Medications  . acetaminophen (TYLENOL) tablet 1,000 mg  . cyclobenzaprine (FLEXERIL) tablet 10 mg  . cyclobenzaprine  (FLEXERIL) 10 MG tablet    Sig: Take 1 tablet (10 mg total) by mouth 3 (three) times daily as needed.    Dispense:  30 tablet    Refill:  0    Order Specific Question:   Supervising Provider    Answer:   DOVE, MYRA C [[id number]]  . Elastic Bandages & Supports (COMFORT FIT MATERNITY SUPP SM) MISC    Sig: 1 Units by Does not apply route daily as needed.    Dispense:  1 each    Refill:  0    Order Specific Question:   Supervising Provider    Answer:   Clovia Cuff C [[id number]]    Assessment and Plan   1. Round ligament pain   2. [redacted] weeks gestation of pregnancy   3. RLQ abdominal pain    Allergies as of 04/02/2019   No Known Allergies     Medication List    TAKE these medications   acetaminophen 500 MG tablet Commonly known as: TYLENOL Take 500 mg by mouth every 6 (six) hours as needed for mild pain or moderate pain.   aspirin EC 81 MG tablet Take 1 tablet (81 mg total) by mouth daily.   Blood Pressure Kit Devi 1 Device by Does  not apply route as needed.   Comfort Fit Maternity Supp Sm Misc 1 Units by Does not apply route daily as needed.   cyclobenzaprine 10 MG tablet Commonly known as: FLEXERIL Take 1 tablet (10 mg total) by mouth 3 (three) times daily as needed.   Doxylamine-Pyridoxine 10-10 MG Tbec Commonly known as: Diclegis Take 2 tablets at bedtime and one in the morning and one in the afternoon as needed for nausea.   PrePLUS 27-1 MG Tabs Take 1 tablet by mouth daily.      -RX Flexeril -RX pregnancy support belt -discussed appropriate use of Tylenol and not to exceed '4000mg'$  daily -strict appendicitis precautions given to patient and support person -return MAU precautions given -pt discharged to home in stable condition  Elmyra Ricks E Cottrell Gentles 04/02/2019, 6:36 AM

## 2019-04-04 LAB — GC/CHLAMYDIA PROBE AMP (~~LOC~~) NOT AT ARMC
Chlamydia: NEGATIVE
Comment: NEGATIVE
Comment: NORMAL
Neisseria Gonorrhea: NEGATIVE

## 2019-04-05 ENCOUNTER — Encounter: Payer: Self-pay | Admitting: General Practice

## 2019-04-11 ENCOUNTER — Encounter (HOSPITAL_COMMUNITY): Payer: Self-pay

## 2019-04-18 ENCOUNTER — Other Ambulatory Visit (HOSPITAL_COMMUNITY): Payer: Self-pay | Admitting: *Deleted

## 2019-04-18 ENCOUNTER — Other Ambulatory Visit: Payer: Self-pay

## 2019-04-18 ENCOUNTER — Ambulatory Visit (HOSPITAL_COMMUNITY)
Admission: RE | Admit: 2019-04-18 | Discharge: 2019-04-18 | Disposition: A | Discharge status: Home / Self Care | Payer: Medicaid Other | Source: Ambulatory Visit | Attending: Obstetrics and Gynecology | Admitting: Obstetrics and Gynecology

## 2019-04-18 ENCOUNTER — Encounter (HOSPITAL_COMMUNITY): Payer: Self-pay | Admitting: *Deleted

## 2019-04-18 ENCOUNTER — Ambulatory Visit (HOSPITAL_COMMUNITY): Payer: Medicaid Other | Admitting: *Deleted

## 2019-04-18 DIAGNOSIS — O0990 Supervision of high risk pregnancy, unspecified, unspecified trimester: Secondary | ICD-10-CM

## 2019-04-18 DIAGNOSIS — O09899 Supervision of other high risk pregnancies, unspecified trimester: Secondary | ICD-10-CM | POA: Diagnosis not present

## 2019-04-18 DIAGNOSIS — O09219 Supervision of pregnancy with history of pre-term labor, unspecified trimester: Secondary | ICD-10-CM | POA: Diagnosis not present

## 2019-04-18 DIAGNOSIS — Z8619 Personal history of other infectious and parasitic diseases: Secondary | ICD-10-CM

## 2019-04-18 DIAGNOSIS — Z8751 Personal history of pre-term labor: Secondary | ICD-10-CM

## 2019-04-18 DIAGNOSIS — O09299 Supervision of pregnancy with other poor reproductive or obstetric history, unspecified trimester: Secondary | ICD-10-CM

## 2019-04-18 DIAGNOSIS — O34219 Maternal care for unspecified type scar from previous cesarean delivery: Secondary | ICD-10-CM | POA: Diagnosis not present

## 2019-04-18 DIAGNOSIS — Z3A19 19 weeks gestation of pregnancy: Secondary | ICD-10-CM | POA: Diagnosis not present

## 2019-04-18 DIAGNOSIS — Z363 Encounter for antenatal screening for malformations: Secondary | ICD-10-CM | POA: Diagnosis not present

## 2019-04-18 DIAGNOSIS — Z8759 Personal history of other complications of pregnancy, childbirth and the puerperium: Secondary | ICD-10-CM

## 2019-04-21 ENCOUNTER — Ambulatory Visit (HOSPITAL_COMMUNITY)
Admission: EM | Admit: 2019-04-21 | Discharge: 2019-04-21 | Disposition: A | Discharge status: Home / Self Care | Payer: Medicaid Other | Attending: Family Medicine | Admitting: Family Medicine

## 2019-04-21 ENCOUNTER — Encounter (HOSPITAL_COMMUNITY): Payer: Self-pay

## 2019-04-21 ENCOUNTER — Other Ambulatory Visit: Payer: Self-pay

## 2019-04-21 ENCOUNTER — Telehealth: Payer: Self-pay | Admitting: Family Medicine

## 2019-04-21 ENCOUNTER — Telehealth: Payer: Medicaid Other

## 2019-04-21 DIAGNOSIS — R519 Headache, unspecified: Secondary | ICD-10-CM | POA: Diagnosis not present

## 2019-04-21 MED ORDER — CETIRIZINE HCL 10 MG PO TABS: 10.0000 mg | ORAL_TABLET | Freq: Every day | ORAL | 0 refills | Status: DC

## 2019-04-21 NOTE — Telephone Encounter (Signed)
Patient called to say she has a headache, and would like a call back from the nurse.

## 2019-04-21 NOTE — Discharge Instructions (Signed)
Taking the x-ray Tylenol for headache You can try adding in some Zyrtec to see if this helps. Make sure you are drinking plenty of water and staying hydrated We will call you with any positive results of your swab

## 2019-04-21 NOTE — ED Triage Notes (Signed)
C/o migraines x2 days. Reports last time she had a migraine like this, she test positive for gonorrhea.

## 2019-04-21 NOTE — Telephone Encounter (Signed)
Called patient, no answer- left message stating we are trying to reach you to return your phone call, please call us back if you still need assistance.  

## 2019-04-22 LAB — CERVICOVAGINAL ANCILLARY ONLY
Bacterial Vaginitis (gardnerella): POSITIVE — AB
Candida Glabrata: NEGATIVE
Candida Vaginitis: NEGATIVE
Chlamydia: NEGATIVE
Comment: NEGATIVE
Comment: NEGATIVE
Comment: NEGATIVE
Comment: NEGATIVE
Comment: NEGATIVE
Comment: NORMAL
Neisseria Gonorrhea: NEGATIVE
Trichomonas: NEGATIVE

## 2019-04-24 NOTE — ED Provider Notes (Signed)
[facility] [place]    CSN: [id number] Arrival date & time: 04/21/19  4098      History   Chief Complaint Chief Complaint  Patient presents with  . Migraine    HPI Sherri Clark is a 29 y.o. female.   Patient is a 29 year old female presents today for headache x2 days.  Symptoms have been constant.  She has been using Tylenol with some relief.  Patient is currently pregnant.  Concern for STD.  Reporting last time she tested positive for gonorrhea she had no other symptoms besides a headache.  Denies any dizziness, blurred vision, photophobia or phonophobia.  Has any nausea or vomiting.  Denies any vaginal discharge, itching or irritation.  No abdominal pain or bleeding.  ROS per HPI      Past Medical History:  Diagnosis Date  . Gonorrhea 2013    Patient Active Problem List   Diagnosis Date Noted  . History of gestational hypertension 03/29/2019  . History of cesarean delivery 02/28/2019  . Morning sickness 02/28/2019  . BMI 29.0-29.9,adult 02/28/2019  . Supervision of high risk pregnancy, antepartum 01/31/2019  . History of gonorrhea 01/31/2019    Past Surgical History:  Procedure Laterality Date  . CESAREAN SECTION    . CHOLECYSTECTOMY    . EYE SURGERY      OB History    Gravida  2   Para  1   Term      Preterm  1   AB      Living  1     SAB      TAB      Ectopic      Multiple      Live Births  1            Home Medications    Prior to Admission medications   Medication Sig Start Date End Date Taking? Authorizing Provider  acetaminophen (TYLENOL) 500 MG tablet Take 500 mg by mouth every 6 (six) hours as needed for mild pain or moderate pain.    [provider]  aspirin EC 81 MG tablet Take 1 tablet (81 mg total) by mouth daily. 03/31/19 06/29/19  Rasch, Anderson Malta I, NP  Blood Pressure Monitoring (BLOOD PRESSURE KIT) DEVI 1 Device by Does not apply route as needed. 01/31/19   Burleson, Rona Ravens, NP  cetirizine (ZYRTEC)  10 MG tablet Take 1 tablet (10 mg total) by mouth daily. 04/21/19   Loura Halt A, NP  cyclobenzaprine (FLEXERIL) 10 MG tablet Take 1 tablet (10 mg total) by mouth 3 (three) times daily as needed. 04/02/19   Nugent, Gerrie Nordmann, NP  Doxylamine-Pyridoxine (DICLEGIS) 10-10 MG TBEC Take 2 tablets at bedtime and one in the morning and one in the afternoon as needed for nausea. Patient not taking: Reported on 03/31/2019 02/28/19   Virginia Rochester, NP  Elastic Bandages & Supports (COMFORT FIT MATERNITY SUPP SM) MISC 1 Units by Does not apply route daily as needed. 04/02/19   Nugent, Gerrie Nordmann, NP  Prenatal Vit-Fe Fumarate-FA (PREPLUS) 27-1 MG TABS Take 1 tablet by mouth daily. 01/20/19   Chancy Milroy, MD    Family History Family History  Problem Relation Age of Onset  . Lupus Mother   . Hypertension Father     Social History Social History   Tobacco Use  . Smoking status: Former Smoker    Types: Cigarettes    Quit date: 01/30/2017    Years since quitting: 2.2  . Smokeless tobacco: Never  Used  Substance Use Topics  . Alcohol use: Never  . Drug use: Never     Allergies   Patient has no known allergies.   Review of Systems Review of Systems   Physical Exam Triage Vital Signs ED Triage Vitals  Enc Vitals Group     BP 04/21/19 0914 101/74     Pulse Rate 04/21/19 0914 78     Resp 04/21/19 0914 18     Temp 04/21/19 0914 98.4 F (36.9 C)     Temp Source 04/21/19 0914 Oral     SpO2 04/21/19 0914 98 %     Weight --      Height --      Head Circumference --      Peak Flow --      Pain Score 04/21/19 0913 10     Pain Loc --      Pain Edu? --      Excl. in [place]? --    No data found.  Updated Vital Signs BP 101/74 (BP Location: Right Arm)   Pulse 78   Temp 98.4 F (36.9 C) (Oral)   Resp 18   LMP 11/28/2018 (Approximate)   SpO2 98%   Visual Acuity Right Eye Distance:   Left Eye Distance:   Bilateral Distance:    Right Eye Near:   Left Eye Near:    Bilateral Near:      Physical Exam Vitals and nursing note reviewed.  Constitutional:      General: She is not in acute distress.    Appearance: Normal appearance. She is not ill-appearing, toxic-appearing or diaphoretic.  HENT:     Head: Normocephalic.     Nose: Nose normal.  Eyes:     Extraocular Movements: Extraocular movements intact.     Conjunctiva/sclera: Conjunctivae normal.     Pupils: Pupils are equal, round, and reactive to light.  Pulmonary:     Effort: Pulmonary effort is normal.  Musculoskeletal:        General: Normal range of motion.     Cervical back: Normal range of motion.  Skin:    General: Skin is warm and dry.     Findings: No rash.  Neurological:     Mental Status: She is alert.  Psychiatric:        Mood and Affect: Mood normal.      UC Treatments / Results  Labs (all labs ordered are listed, but only abnormal results are displayed) Labs Reviewed  CERVICOVAGINAL ANCILLARY ONLY - Abnormal; Notable for the following components:      Result Value   Bacterial Vaginitis (gardnerella) Positive (*)    All other components within normal limits    EKG   Radiology No results found.  Procedures Procedures (including critical care time)  Medications Ordered in UC Medications - No data to display  Initial Impression / Assessment and Plan / UC Course  I have reviewed the triage vital signs and the nursing notes.  Pertinent labs & imaging results that were available during my care of the patient were reviewed by me and considered in my medical decision making (see chart for details).     Headache-recommend keep taking Tylenol as needed. Also recommended trying to do some Zyrtec in case this is allergy related headache Swab sent for testing with labs pending Follow up as needed for continued or worsening symptoms  Final Clinical Impressions(s) / UC Diagnoses   Final diagnoses:  Bad headache     Discharge Instructions  Taking the x-ray Tylenol for  headache You can try adding in some Zyrtec to see if this helps. Make sure you are drinking plenty of water and staying hydrated We will call you with any positive results of your swab    ED Prescriptions    Medication Sig Dispense Auth. Provider   cetirizine (ZYRTEC) 10 MG tablet Take 1 tablet (10 mg total) by mouth daily. 30 tablet Loura Halt A, NP     PDMP not reviewed this encounter.   Loura Halt A, NP 04/24/19 1018

## 2019-04-25 ENCOUNTER — Other Ambulatory Visit: Payer: Self-pay | Admitting: *Deleted

## 2019-04-25 MED ORDER — METRONIDAZOLE 0.75 % VA GEL: VAGINAL | 0 refills | Status: DC

## 2019-05-04 ENCOUNTER — Ambulatory Visit (INDEPENDENT_AMBULATORY_CARE_PROVIDER_SITE_OTHER): Payer: Medicaid Other | Admitting: Obstetrics and Gynecology

## 2019-05-04 ENCOUNTER — Other Ambulatory Visit: Payer: Self-pay

## 2019-05-04 ENCOUNTER — Encounter: Payer: Self-pay | Admitting: Obstetrics and Gynecology

## 2019-05-04 ENCOUNTER — Encounter: Payer: Self-pay | Admitting: Family Medicine

## 2019-05-04 VITALS — BP 129/73 | HR 94 | Wt 215.5 lb

## 2019-05-04 DIAGNOSIS — Z98891 History of uterine scar from previous surgery: Secondary | ICD-10-CM

## 2019-05-04 DIAGNOSIS — O0990 Supervision of high risk pregnancy, unspecified, unspecified trimester: Secondary | ICD-10-CM

## 2019-05-04 DIAGNOSIS — O34219 Maternal care for unspecified type scar from previous cesarean delivery: Secondary | ICD-10-CM

## 2019-05-04 DIAGNOSIS — Z3A21 21 weeks gestation of pregnancy: Secondary | ICD-10-CM

## 2019-05-04 DIAGNOSIS — Z8759 Personal history of other complications of pregnancy, childbirth and the puerperium: Secondary | ICD-10-CM

## 2019-05-04 MED ORDER — COMFORT FIT MATERNITY SUPP MED MISC: 0 refills | Status: DC

## 2019-05-04 NOTE — Progress Notes (Signed)
   PRENATAL VISIT NOTE  Subjective:  Sherri Clark is a 29 y.o. G2P0101 at [redacted]w[redacted]d being seen today for ongoing prenatal care.  She is currently monitored for the following issues for this low-risk pregnancy and has Supervision of high risk pregnancy, antepartum; History of gonorrhea; History of cesarean delivery; Morning sickness; BMI 29.0-29.9,adult; and History of gestational hypertension on their problem list.  Patient reports no complaints.  Contractions: Not present. Vag. Bleeding: None.  Movement: Present. Denies leaking of fluid.   The following portions of the patient's history were reviewed and updated as appropriate: allergies, current medications, past family history, past medical history, past social history, past surgical history and problem list.   Objective:   Vitals:   05/04/19 1616  BP: 129/73  Pulse: 94  Weight: 215 lb 8 oz (97.8 kg)    Fetal Status: Fetal Heart Rate (bpm): 138 Fundal Height: 21 cm Movement: Present     General:  Alert, oriented and cooperative. Patient is in no acute distress.  Skin: Skin is warm and dry. No rash noted.   Cardiovascular: Normal heart rate noted  Respiratory: Normal respiratory effort, no problems with respiration noted  Abdomen: Soft, gravid, appropriate for gestational age.  Pain/Pressure: Absent     Pelvic: Cervical exam deferred        Extremities: Normal range of motion.  Edema: None  Mental Status: Normal mood and affect. Normal behavior. Normal judgment and thought content.   Assessment and Plan:  Pregnancy: G2P0101 at [redacted]w[redacted]d 1. Supervision of high risk pregnancy, antepartum Patient is doing well without complaints Follow up anatomy 5/3 Work restriction letter and maternity support belt provided  2. History of gestational hypertension Normotensive today Continue ASA  3. History of cesarean delivery Operative note requested Patient is interested in TOLAC  Preterm labor symptoms and general obstetric precautions  including but not limited to vaginal bleeding, contractions, leaking of fluid and fetal movement were reviewed in detail with the patient. Please refer to After Visit Summary for other counseling recommendations.   Return in about 4 weeks (around 06/01/2019) for in person, ROB, Low risk.  Future Appointments  Date Time Provider Department Center  05/16/2019  1:45 PM [facility] NURSE [facility]  05/16/2019  1:45 PM [facility] [place] [facility]    Catalina Antigua, MD

## 2019-05-13 DIAGNOSIS — Z03818 Encounter for observation for suspected exposure to other biological agents ruled out: Secondary | ICD-10-CM | POA: Diagnosis not present

## 2019-05-13 DIAGNOSIS — Z20828 Contact with and (suspected) exposure to other viral communicable diseases: Secondary | ICD-10-CM | POA: Diagnosis not present

## 2019-05-16 ENCOUNTER — Other Ambulatory Visit: Payer: Self-pay

## 2019-05-16 ENCOUNTER — Ambulatory Visit: Payer: Medicaid Other | Admitting: *Deleted

## 2019-05-16 ENCOUNTER — Ambulatory Visit (HOSPITAL_COMMUNITY): Payer: Medicaid Other | Attending: Obstetrics and Gynecology

## 2019-05-16 DIAGNOSIS — Z362 Encounter for other antenatal screening follow-up: Secondary | ICD-10-CM | POA: Diagnosis not present

## 2019-05-16 DIAGNOSIS — Z8619 Personal history of other infectious and parasitic diseases: Secondary | ICD-10-CM

## 2019-05-16 DIAGNOSIS — O34219 Maternal care for unspecified type scar from previous cesarean delivery: Secondary | ICD-10-CM | POA: Insufficient documentation

## 2019-05-16 DIAGNOSIS — Z8751 Personal history of pre-term labor: Secondary | ICD-10-CM | POA: Diagnosis not present

## 2019-05-16 DIAGNOSIS — Z3A23 23 weeks gestation of pregnancy: Secondary | ICD-10-CM | POA: Diagnosis not present

## 2019-05-16 DIAGNOSIS — O09292 Supervision of pregnancy with other poor reproductive or obstetric history, second trimester: Secondary | ICD-10-CM | POA: Diagnosis not present

## 2019-05-16 DIAGNOSIS — O09212 Supervision of pregnancy with history of pre-term labor, second trimester: Secondary | ICD-10-CM | POA: Diagnosis not present

## 2019-05-16 DIAGNOSIS — Z8759 Personal history of other complications of pregnancy, childbirth and the puerperium: Secondary | ICD-10-CM

## 2019-05-16 DIAGNOSIS — O0990 Supervision of high risk pregnancy, unspecified, unspecified trimester: Secondary | ICD-10-CM

## 2019-05-31 ENCOUNTER — Inpatient Hospital Stay (HOSPITAL_COMMUNITY)
Admission: AD | Admit: 2019-05-31 | Discharge: 2019-05-31 | Disposition: A | Discharge status: Home / Self Care | Payer: Medicaid Other | Attending: Obstetrics and Gynecology | Admitting: Obstetrics and Gynecology

## 2019-05-31 ENCOUNTER — Encounter (HOSPITAL_COMMUNITY): Payer: Self-pay | Admitting: Obstetrics and Gynecology

## 2019-05-31 DIAGNOSIS — O26892 Other specified pregnancy related conditions, second trimester: Secondary | ICD-10-CM | POA: Insufficient documentation

## 2019-05-31 DIAGNOSIS — Z7982 Long term (current) use of aspirin: Secondary | ICD-10-CM | POA: Diagnosis not present

## 2019-05-31 DIAGNOSIS — R42 Dizziness and giddiness: Secondary | ICD-10-CM | POA: Diagnosis not present

## 2019-05-31 DIAGNOSIS — E860 Dehydration: Secondary | ICD-10-CM | POA: Diagnosis not present

## 2019-05-31 DIAGNOSIS — E161 Other hypoglycemia: Secondary | ICD-10-CM | POA: Diagnosis not present

## 2019-05-31 DIAGNOSIS — Z3A25 25 weeks gestation of pregnancy: Secondary | ICD-10-CM | POA: Diagnosis not present

## 2019-05-31 DIAGNOSIS — Z8619 Personal history of other infectious and parasitic diseases: Secondary | ICD-10-CM

## 2019-05-31 DIAGNOSIS — Z8759 Personal history of other complications of pregnancy, childbirth and the puerperium: Secondary | ICD-10-CM

## 2019-05-31 DIAGNOSIS — E162 Hypoglycemia, unspecified: Secondary | ICD-10-CM | POA: Diagnosis not present

## 2019-05-31 DIAGNOSIS — O99891 Other specified diseases and conditions complicating pregnancy: Secondary | ICD-10-CM | POA: Diagnosis not present

## 2019-05-31 DIAGNOSIS — Z87891 Personal history of nicotine dependence: Secondary | ICD-10-CM | POA: Insufficient documentation

## 2019-05-31 DIAGNOSIS — I10 Essential (primary) hypertension: Secondary | ICD-10-CM | POA: Diagnosis not present

## 2019-05-31 DIAGNOSIS — O0990 Supervision of high risk pregnancy, unspecified, unspecified trimester: Secondary | ICD-10-CM

## 2019-05-31 LAB — URINALYSIS, ROUTINE W REFLEX MICROSCOPIC
Bilirubin Urine: NEGATIVE
Glucose, UA: NEGATIVE mg/dL
Hgb urine dipstick: NEGATIVE
Ketones, ur: NEGATIVE mg/dL
Leukocytes,Ua: NEGATIVE
Nitrite: NEGATIVE
Protein, ur: NEGATIVE mg/dL
Specific Gravity, Urine: 1.02 (ref 1.005–1.030)
pH: 5 (ref 5.0–8.0)

## 2019-05-31 LAB — COMPREHENSIVE METABOLIC PANEL
ALT: 16 U/L (ref 0–44)
AST: 18 U/L (ref 15–41)
Albumin: 2.8 g/dL — ABNORMAL LOW (ref 3.5–5.0)
Alkaline Phosphatase: 67 U/L (ref 38–126)
Anion gap: 9 (ref 5–15)
BUN: 5 mg/dL — ABNORMAL LOW (ref 6–20)
CO2: 23 mmol/L (ref 22–32)
Calcium: 9 mg/dL (ref 8.9–10.3)
Chloride: 105 mmol/L (ref 98–111)
Creatinine, Ser: 0.57 mg/dL (ref 0.44–1.00)
GFR calc Af Amer: >60 mL/min (ref >60)
GFR calc non Af Amer: >60 mL/min (ref >60)
Glucose, Bld: 60 mg/dL — ABNORMAL LOW (ref 70–99)
Potassium: 3.7 mmol/L (ref 3.5–5.1)
Sodium: 137 mmol/L (ref 135–145)
Total Bilirubin: 0.4 mg/dL (ref 0.3–1.2)
Total Protein: 6.4 g/dL — ABNORMAL LOW (ref 6.5–8.1)

## 2019-05-31 LAB — CBC
HCT: 36 % (ref 36.0–46.0)
Hemoglobin: 12.1 g/dL (ref 12.0–15.0)
MCH: 32 pg (ref 26.0–34.0)
MCHC: 33.6 g/dL (ref 30.0–36.0)
MCV: 95.2 fL (ref 80.0–100.0)
Platelets: 289 10*3/uL (ref 150–400)
RBC: 3.78 MIL/uL — ABNORMAL LOW (ref 3.87–5.11)
RDW: 13.5 % (ref 11.5–15.5)
WBC: 7.8 10*3/uL (ref 4.0–10.5)
nRBC: 0 % (ref 0.0–0.2)

## 2019-05-31 NOTE — MAU Note (Signed)
Pt arrived via EMS. Was at work and felt dizzy. Did not loose consciousness. Denies cntrx, bleeding, LOF or vaginal discharge. Feeling fetal movement. Had eaten cereal and drank apple juice. Works on [employer] in [employer] - does not think she drinks enough water. Has had dizzy episodes before.  PT denies pain.

## 2019-05-31 NOTE — MAU Provider Note (Signed)
History     CSN: [id number]  Arrival date and time: 05/31/19 1119   First Provider Initiated Contact with Patient 05/31/19 1145      Chief Complaint  Patient presents with  . Dizziness   HPI Sherri Clark is a 29 y.o. G2P0101 at 34w2dwho presents to MAU via EMS for evaluation of an episode of dizziness during her work shift. Patient reports recurrent episodes of mild dizziness due to prolonged standing. She ate breakfast but is unable to have access to snacks or water until her lunch break. She states she started to feel lightheaded, was assisted to a chair, and on-site EMS were requested and determined her to be hypotensive. She denies administration or juice or crackers. She denies syncope. She denies all other ob concerns including abdominal pain, vaginal bleeding, decreased fetal movement, fever or recent illness.  [facility] and her next appointment is tomorrow 05/19.   OB History    Gravida  2   Para  1   Term      Preterm  1   AB      Living  1     SAB      TAB      Ectopic      Multiple      Live Births  1           Past Medical History:  Diagnosis Date  . Gonorrhea 2013    Past Surgical History:  Procedure Laterality Date  . CESAREAN SECTION    . CHOLECYSTECTOMY    . EYE SURGERY      Family History  Problem Relation Age of Onset  . Lupus Mother   . Hypertension Father     Social History   Tobacco Use  . Smoking status: Former Smoker    Types: Cigarettes    Quit date: 01/30/2017    Years since quitting: 2.3  . Smokeless tobacco: Never Used  Substance Use Topics  . Alcohol use: Never  . Drug use: Never    Allergies: No Known Allergies  Medications Prior to Admission  Medication Sig Dispense Refill Last Dose  . cetirizine (ZYRTEC) 10 MG tablet Take 1 tablet (10 mg total) by mouth daily. 30 tablet 0 Past Week at Unknown time  . Prenatal Vit-Fe Fumarate-FA (PREPLUS) 27-1 MG TABS Take 1  tablet by mouth daily. 30 tablet 6 05/30/2019 at Unknown time  . acetaminophen (TYLENOL) 500 MG tablet Take 500 mg by mouth every 6 (six) hours as needed for mild pain or moderate pain.     .Marland Kitchenaspirin EC 81 MG tablet Take 1 tablet (81 mg total) by mouth daily. 90 tablet 1  at not taking  . Blood Pressure Monitoring (BLOOD PRESSURE KIT) DEVI 1 Device by Does not apply route as needed. 1 each 0   . cyclobenzaprine (FLEXERIL) 10 MG tablet Take 1 tablet (10 mg total) by mouth 3 (three) times daily as needed. (Patient not taking: Reported on 05/04/2019) 30 tablet 0   . Doxylamine-Pyridoxine (DICLEGIS) 10-10 MG TBEC Take 2 tablets at bedtime and one in the morning and one in the afternoon as needed for nausea. (Patient not taking: Reported on 03/31/2019) 60 tablet 2   . Elastic Bandages & Supports (COMFORT FIT MATERNITY SUPP MED) MISC Wear daily when ambulating 1 each 0   . Elastic Bandages & Supports (COMFORT FIT MATERNITY SUPP SM) MISC 1 Units by Does not apply route daily as needed. (Patient  not taking: Reported on 05/04/2019) 1 each 0   . metroNIDAZOLE (METROGEL VAGINAL) 0.75 % vaginal gel Place 1 applicatorful in vagina at bedtime daily for 7 nights. (Patient not taking: Reported on 05/04/2019) 70 g 0     Review of Systems  Constitutional: Negative for chills, fatigue and fever.  Respiratory: Negative for shortness of breath.   Gastrointestinal: Negative for abdominal pain.  Genitourinary: Negative for dysuria and vaginal bleeding.  Musculoskeletal: Negative for back pain.  Neurological: Positive for dizziness. Negative for syncope and weakness.  All other systems reviewed and are negative.  Physical Exam   Blood pressure 133/74, pulse 86, temperature 98.1 F (36.7 C), temperature source Oral, resp. rate 18, last menstrual period 11/28/2018, SpO2 100 %, unknown if currently breastfeeding.  Physical Exam  Nursing note and vitals reviewed. Constitutional: She is oriented to person, place, and time.  She appears well-developed and well-nourished.  Cardiovascular: Normal rate and normal heart sounds.  Respiratory: Effort normal and breath sounds normal.  GI: Soft. She exhibits no distension. There is no abdominal tenderness. There is no rebound and no guarding.  Gravid  Musculoskeletal:        General: Normal range of motion.  Neurological: She is alert and oriented to person, place, and time. She has normal strength. No sensory deficit. She displays a negative Romberg sign. Coordination and gait normal.  Skin: Skin is warm and dry.  Psychiatric: She has a normal mood and affect. Her behavior is normal. Judgment and thought content normal.    MAU Course/MDM  Procedures  --Hypotensive per workplace "EMS", normotensive per ambulance crew, CBG 80 on truck --Reassuring fetal surveillance --Patient A&O x 4, appropriately responsive upon arrival and throughout evaluation in MAU --No concerning findings on physical exam or labs collected in MAU --Pertinent negatives: weakness, syncope, palpitations,  --Discussed small changes to potentially reduce episodes including adding mid-morning snack, protein at each meal especially at bedtime, water breaks during work shift, rest breaks . Patient given letter to present to employer  Patient Vitals for the past 24 hrs:  BP Temp Temp src Pulse Resp SpO2  05/31/19 1304 133/74 -- -- -- -- --  05/31/19 1215 -- -- -- -- -- 100 %  05/31/19 1210 -- -- -- -- -- 100 %  05/31/19 1205 -- -- -- -- -- 100 %  05/31/19 1200 -- -- -- -- -- 100 %  05/31/19 1155 -- -- -- -- -- 100 %  05/31/19 1146 116/62 -- -- 86 -- --  05/31/19 1135 129/66 98.1 F (36.7 C) Oral 84 18 --   Results for orders placed or performed during the hospital encounter of 05/31/19 (from the past 24 hour(s))  Urinalysis, Routine w reflex microscopic     Status: None   Collection Time: 05/31/19 11:37 AM  Result Value Ref Range   Color, Urine YELLOW YELLOW   APPearance CLEAR CLEAR    Specific Gravity, Urine 1.020 1.005 - 1.030   pH 5.0 5.0 - 8.0   Glucose, UA NEGATIVE NEGATIVE mg/dL   Hgb urine dipstick NEGATIVE NEGATIVE   Bilirubin Urine NEGATIVE NEGATIVE   Ketones, ur NEGATIVE NEGATIVE mg/dL   Protein, ur NEGATIVE NEGATIVE mg/dL   Nitrite NEGATIVE NEGATIVE   Leukocytes,Ua NEGATIVE NEGATIVE  CBC     Status: Abnormal   Collection Time: 05/31/19 11:41 AM  Result Value Ref Range   WBC 7.8 4.0 - 10.5 K/uL   RBC 3.78 (L) 3.87 - 5.11 MIL/uL   Hemoglobin 12.1 12.0 -  15.0 g/dL   HCT 36.0 36.0 - 46.0 %   MCV 95.2 80.0 - 100.0 fL   MCH 32.0 26.0 - 34.0 pg   MCHC 33.6 30.0 - 36.0 g/dL   RDW 13.5 11.5 - 15.5 %   Platelets 289 150 - 400 K/uL   nRBC 0.0 0.0 - 0.2 %  Comprehensive metabolic panel     Status: Abnormal   Collection Time: 05/31/19 11:41 AM  Result Value Ref Range   Sodium 137 135 - 145 mmol/L   Potassium 3.7 3.5 - 5.1 mmol/L   Chloride 105 98 - 111 mmol/L   CO2 23 22 - 32 mmol/L   Glucose, Bld 60 (L) 70 - 99 mg/dL   BUN 5 (L) 6 - 20 mg/dL   Creatinine, Ser 0.57 0.44 - 1.00 mg/dL   Calcium 9.0 8.9 - 10.3 mg/dL   Total Protein 6.4 (L) 6.5 - 8.1 g/dL   Albumin 2.8 (L) 3.5 - 5.0 g/dL   AST 18 15 - 41 U/L   ALT 16 0 - 44 U/L   Alkaline Phosphatase 67 38 - 126 U/L   Total Bilirubin 0.4 0.3 - 1.2 mg/dL   GFR calc non Af Amer >60 >60 mL/min   GFR calc Af Amer >60 >60 mL/min   Anion gap 9 5 - 15    Assessment and Plan  --29 y.o. G2P0101 at [redacted]w[redacted]d --Reassuring fetal surveillance --Work note to increase calories, decrease prolonged standing and episodes of low blood sugar --Discharge home in stable condition  SDarlina Rumpf CNunam Iqua5/18/2021, 3:37 PM

## 2019-05-31 NOTE — Discharge Instructions (Signed)
Eating Plan for Pregnant Women While you are pregnant, your body requires additional nutrition to help support your growing baby. You also have a higher need for some vitamins and minerals, such as folic acid, calcium, iron, and vitamin D. Eating a healthy, well-balanced diet is very important for your health and your baby's health. Your need for extra calories varies for the three 3-month segments of your pregnancy (trimesters). For most women, it is recommended to consume:  150 extra calories a day during the first trimester.  300 extra calories a day during the second trimester.  300 extra calories a day during the third trimester. What are tips for following this plan?   Do not try to lose weight or go on a diet during pregnancy.  Limit your overall intake of foods that have "empty calories." These are foods that have little nutritional value, such as sweets, desserts, candies, and sugar-sweetened beverages.  Eat a variety of foods (especially fruits and vegetables) to get a full range of vitamins and minerals.  Take a prenatal vitamin to help meet your additional vitamin and mineral needs during pregnancy, specifically for folic acid, iron, calcium, and vitamin D.  Remember to stay active. Ask your health care provider what types of exercise and activities are safe for you.  Practice good food safety and cleanliness. Wash your hands before you eat and after you prepare raw meat. Wash all fruits and vegetables well before peeling or eating. Taking these actions can help to prevent food-borne illnesses that can be very dangerous to your baby, such as listeriosis. Ask your health care provider for more information about listeriosis. What does 150 extra calories look like? Healthy options that provide 150 extra calories each day could be any of the following:  6-8 oz (170-230 g) of plain low-fat yogurt with  cup of berries.  1 apple with 2 teaspoons (11 g) of peanut butter.  Cut-up  vegetables with  cup (60 g) of hummus.  8 oz (230 mL) or 1 cup of low-fat chocolate milk.  1 stick of string cheese with 1 medium orange.  1 peanut butter and jelly sandwich that is made with one slice of whole-wheat bread and 1 tsp (5 g) of peanut butter. For 300 extra calories, you could eat two of those healthy options each day. What is a healthy amount of weight to gain? The right amount of weight gain for you is based on your BMI before you became pregnant. If your BMI:  Was less than 18 (underweight), you should gain 28-40 lb (13-18 kg).  Was 18-24.9 (normal), you should gain 25-35 lb (11-16 kg).  Was 25-29.9 (overweight), you should gain 15-25 lb (7-11 kg).  Was 30 or greater (obese), you should gain 11-20 lb (5-9 kg). What if I am having twins or multiples? Generally, if you are carrying twins or multiples:  You may need to eat 300-600 extra calories a day.  The recommended range for total weight gain is 25-54 lb (11-25 kg), depending on your BMI before pregnancy.  Talk with your health care provider to find out about nutritional needs, weight gain, and exercise that is right for you. What foods can I eat?  Fruits All fruits. Eat a variety of colors and types of fruit. Remember to wash your fruits well before peeling or eating. Vegetables All vegetables. Eat a variety of colors and types of vegetables. Remember to wash your vegetables well before peeling or eating. Grains All grains. Choose whole grains, such   as whole-wheat bread, oatmeal, or brown rice. Meats and other protein foods Lean meats, including chicken, turkey, fish, and lean cuts of beef, veal, or pork. If you eat fish or seafood, choose options that are higher in omega-3 fatty acids and lower in mercury, such as salmon, herring, mussels, trout, sardines, pollock, shrimp, crab, and lobster. Tofu. Tempeh. Beans. Eggs. Peanut butter and other nut butters. Make sure that all meats, poultry, and eggs are cooked to  food-safe temperatures or "well-done." Two or more servings of fish are recommended each week in order to get the most benefits from omega-3 fatty acids that are found in seafood. Choose fish that are lower in mercury. You can find more information online:  www.fda.gov Dairy Pasteurized milk and milk alternatives (such as almond milk). Pasteurized yogurt and pasteurized cheese. Cottage cheese. Sour cream. Beverages Water. Juices that contain 100% fruit juice or vegetable juice. Caffeine-free teas and decaffeinated coffee. Drinks that contain caffeine are okay to drink, but it is better to avoid caffeine. Keep your total caffeine intake to less than 200 mg each day (which is 12 oz or 355 mL of coffee, tea, or soda) or the limit as told by your health care provider. Fats and oils Fats and oils are okay to include in moderation. Sweets and desserts Sweets and desserts are okay to include in moderation. Seasoning and other foods All pasteurized condiments. The items listed above may not be a complete list of foods and beverages you can eat. Contact a dietitian for more information. What foods are not recommended? Fruits Unpasteurized fruit juices. Vegetables Raw (unpasteurized) vegetable juices. Meats and other protein foods Lunch meats, bologna, hot dogs, or other deli meats. (If you must eat those meats, reheat them until they are steaming hot.) Refrigerated pat, meat spreads from a meat counter, smoked seafood that is found in the refrigerated section of a store. Raw or undercooked meats, poultry, and eggs. Raw fish, such as sushi or sashimi. Fish that have high mercury content, such as tilefish, shark, swordfish, and king mackerel. To learn more about mercury in fish, talk with your health care provider or look for online resources, such as:  www.fda.gov Dairy Raw (unpasteurized) milk and any foods that have raw milk in them. Soft cheeses, such as feta, queso blanco, queso fresco, Brie,  Camembert cheeses, blue-veined cheeses, and Panela cheese (unless it is made with pasteurized milk, which must be stated on the label). Beverages Alcohol. Sugar-sweetened beverages, such as sodas, teas, or energy drinks. Seasoning and other foods Homemade fermented foods and drinks, such as pickles, sauerkraut, or kombucha drinks. (Store-bought pasteurized versions of these are okay.) Salads that are made in a store or deli, such as ham salad, chicken salad, egg salad, tuna salad, and seafood salad. The items listed above may not be a complete list of foods and beverages you should avoid. Contact a dietitian for more information. Where to find more information To calculate the number of calories you need based on your height, weight, and activity level, you can use an online calculator such as:  www.choosemyplate.gov/MyPlatePlan To calculate how much weight you should gain during pregnancy, you can use an online pregnancy weight gain calculator such as:  www.choosemyplate.gov/pregnancy-weight-gain-calculator Summary  While you are pregnant, your body requires additional nutrition to help support your growing baby.  Eat a variety of foods, especially fruits and vegetables to get a full range of vitamins and minerals.  Practice good food safety and cleanliness. Wash your hands before you eat   and after you prepare raw meat. Wash all fruits and vegetables well before peeling or eating. Taking these actions can help to prevent food-borne illnesses, such as listeriosis, that can be very dangerous to your baby.  Do not eat raw meat or fish. Do not eat fish that have high mercury content, such as tilefish, shark, swordfish, and king mackerel. Do not eat unpasteurized (raw) dairy.  Take a prenatal vitamin to help meet your additional vitamin and mineral needs during pregnancy, specifically for folic acid, iron, calcium, and vitamin D. This information is not intended to replace advice given to you by  your health care provider. Make sure you discuss any questions you have with your health care provider. Document Revised: 05/20/2018 Document Reviewed: 09/26/2016 Elsevier Patient Education  2020 Elsevier Inc.  

## 2019-06-01 ENCOUNTER — Encounter: Payer: Medicaid Other | Admitting: Medical

## 2019-06-07 ENCOUNTER — Encounter: Payer: Self-pay | Admitting: Nurse Practitioner

## 2019-06-07 ENCOUNTER — Ambulatory Visit (INDEPENDENT_AMBULATORY_CARE_PROVIDER_SITE_OTHER): Payer: Medicaid Other | Admitting: Nurse Practitioner

## 2019-06-07 ENCOUNTER — Other Ambulatory Visit: Payer: Self-pay

## 2019-06-07 VITALS — BP 91/51 | HR 91 | Wt 223.0 lb

## 2019-06-07 DIAGNOSIS — O09292 Supervision of pregnancy with other poor reproductive or obstetric history, second trimester: Secondary | ICD-10-CM

## 2019-06-07 DIAGNOSIS — Z3A26 26 weeks gestation of pregnancy: Secondary | ICD-10-CM

## 2019-06-07 DIAGNOSIS — Z8759 Personal history of other complications of pregnancy, childbirth and the puerperium: Secondary | ICD-10-CM

## 2019-06-07 DIAGNOSIS — O99342 Other mental disorders complicating pregnancy, second trimester: Secondary | ICD-10-CM

## 2019-06-07 DIAGNOSIS — O0990 Supervision of high risk pregnancy, unspecified, unspecified trimester: Secondary | ICD-10-CM

## 2019-06-07 DIAGNOSIS — O34219 Maternal care for unspecified type scar from previous cesarean delivery: Secondary | ICD-10-CM

## 2019-06-07 DIAGNOSIS — F439 Reaction to severe stress, unspecified: Secondary | ICD-10-CM

## 2019-06-07 NOTE — Progress Notes (Signed)
    Subjective:  Sherri Clark is a 29 y.o. G2P0101 at [redacted]w[redacted]d being seen today for ongoing prenatal care.  She is currently monitored for the following issues for this low-risk pregnancy and has Supervision of high risk pregnancy, antepartum; History of gonorrhea; History of cesarean delivery; Morning sickness; BMI 29.0-29.9,adult; and History of gestational hypertension on their problem list.  Patient reports stress from loss of job.  Contractions: Not present. Vag. Bleeding: None.  Movement: Present. Denies leaking of fluid.   The following portions of the patient's history were reviewed and updated as appropriate: allergies, current medications, past family history, past medical history, past social history, past surgical history and problem list. Problem list updated.  Objective:   Vitals:   06/07/19 1057  BP: (!) 91/51  Pulse: 91  Weight: 223 lb (101.2 kg)    Fetal Status:     Movement: Present     General:  Alert, oriented and cooperative. Patient is in no acute distress.  Skin: Skin is warm and dry. No rash noted.   Cardiovascular: Normal heart rate noted  Respiratory: Normal respiratory effort, no problems with respiration noted  Abdomen: Soft, gravid, appropriate for gestational age. Pain/Pressure: Absent     Pelvic:  Cervical exam deferred        Extremities: Normal range of motion.  Edema: None  Mental Status: Normal mood and affect. Normal behavior. Normal judgment and thought content.   Urinalysis:      Assessment and Plan:  Pregnancy: G2P0101 at [redacted]w[redacted]d  1. Supervision of high risk pregnancy, antepartum Is not recording BPs in Babyscripts Doing well.  Has not eaten today and noted BP is low.  2. History of gestational hypertension   3. Stress at home Note for work breaks given in MAU and then was let go from her job.  Has food insecurity and resources given.  Also sent referral to see Asher Muir.  - Ambulatory referral to Integrated Behavioral Health  Preterm  labor symptoms and general obstetric precautions including but not limited to vaginal bleeding, contractions, leaking of fluid and fetal movement were reviewed in detail with the patient. Please refer to After Visit Summary for other counseling recommendations.  Return in about 2 weeks (around 06/21/2019) for 2 hr glucola and ROB - early AM appointment.  Nolene Bernheim, RN, MSN, NP-BC Nurse Practitioner, [facility] for [employer], [facility] 06/07/2019 11:21 AM

## 2019-06-07 NOTE — Patient Instructions (Addendum)
[facility] [employer]  Department of Social Services[place] [employer] [address], [place], [place] [address] [phone]   or  www.https://hall.info/ SNAP/EBT/ Other nutritional benefits  [facility] [address] [place], [place], [place] [address] [phone]  or  https://king.net/ **WIC for  women who are pregnant and postpartum, infants and children up to 27 years old  [employer] [facility] [address], [place], [place] [address] [phone]   or   www.theblessedtable.org  **Food pantry  [employer] [address] [place], [place], [place] [address] [phone]   or   https://brotherkolbes.godaddysites.com  **Emergency food and prepared meals  [address] [employer] [employer] [facility] [address], [place], [place] [address] [phone]   or   www.cedargrovetop.us **Food pantry  [facility] [address] [place], [place] [address] [phone]   or   www.GolfingFamily.no **Food pantry  [employer] [facility] [address] [place], [place] [address] [phone] **Food pantry  [facility] [address], [place], [place] [address] [phone]   or   www.greensborourbanministry.org  Tour manager and prepared meals  [facility] [address] [place], [address], [place], [place] [address] VerifiedMovies.gl  **Food pantry  [employer] [facility] [address], [place], [place] [address] [phone]   or   www.lbcnow.org  **Food pantry  [employer] [address], [place], [place] [address] [phone]   or   WorkingMBA.co.nz **Food pantry, nutrition education, gardening activities  [facility] [address], [place], [place] [address] [phone] **Food pantry  [facility] [address] [place], [place] [address] [phone]   or   www.salvationarmyof[place].[facility] [place] [address], [place], [place] [address] [phone]   or   http://senior-resources-[place].org [employer] Food on Wheels Program  [facility] [address], [place], [place] [address] [phone]   or   www.stmattchurch.com  **Food pantry  [facility] [address], [place], [place] [address] [phone]   or   vandaliapresbyterianchurch.org **Microbiologist of the [employer]     Sign up for childbirth and breastfeeding classes. Babyscripts - Resources and Childbirth

## 2019-06-21 NOTE — BH Specialist Note (Signed)
Pt did not arrive to video visit and did not answer the phone, as "call cannot be completed at this time" at both attempts to reach pt, and unable to leave a voice message; left MyChart message for patient.    Integrated Behavioral Health via Telemedicine Video Visit  06/21/2019 Sherri Clark [id number]   [name]

## 2019-06-23 ENCOUNTER — Other Ambulatory Visit: Payer: Self-pay

## 2019-06-23 ENCOUNTER — Other Ambulatory Visit: Payer: Medicaid Other

## 2019-06-23 ENCOUNTER — Ambulatory Visit (INDEPENDENT_AMBULATORY_CARE_PROVIDER_SITE_OTHER): Payer: Medicaid Other | Admitting: Obstetrics and Gynecology

## 2019-06-23 DIAGNOSIS — O0993 Supervision of high risk pregnancy, unspecified, third trimester: Secondary | ICD-10-CM

## 2019-06-23 DIAGNOSIS — Z23 Encounter for immunization: Secondary | ICD-10-CM

## 2019-06-23 DIAGNOSIS — O0990 Supervision of high risk pregnancy, unspecified, unspecified trimester: Secondary | ICD-10-CM | POA: Diagnosis not present

## 2019-06-23 DIAGNOSIS — Z3A28 28 weeks gestation of pregnancy: Secondary | ICD-10-CM

## 2019-06-23 NOTE — Addendum Note (Signed)
Addended by: Duwaine Maxin on: 06/23/2019 08:50 AM   Modules accepted: Orders

## 2019-06-23 NOTE — Progress Notes (Signed)
   PRENATAL VISIT NOTE  Subjective:  Sherri Clark is a 29 y.o. G2P0101 at [redacted]w[redacted]d being seen today for ongoing prenatal care.  She is currently monitored for the following issues for this low-risk pregnancy and has Supervision of high risk pregnancy, antepartum; History of gonorrhea; History of cesarean delivery; Morning sickness; BMI 29.0-29.9,adult; and History of gestational hypertension on their problem list.  Patient reports no complaints.  Contractions: Not present. Vag. Bleeding: None.  Movement: Present. Denies leaking of fluid.   The following portions of the patient's history were reviewed and updated as appropriate: allergies, current medications, past family history, past medical history, past social history, past surgical history and problem list.   Objective:   Vitals:   06/23/19 0948  BP: 107/69  Pulse: 82  Weight: 230 lb 6.4 oz (104.5 kg)    Fetal Status: Fetal Heart Rate (bpm): 138 Fundal Height: 28 cm Movement: Present     General:  Alert, oriented and cooperative. Patient is in no acute distress.  Skin: Skin is warm and dry. No rash noted.   Cardiovascular: Normal heart rate noted  Respiratory: Normal respiratory effort, no problems with respiration noted  Abdomen: Soft, gravid, appropriate for gestational age.  Pain/Pressure: Absent     Pelvic: Cervical exam deferred        Extremities: Normal range of motion.  Edema: None  Mental Status: Normal mood and affect. Normal behavior. Normal judgment and thought content.   Assessment and Plan:  Pregnancy: G2P0101 at [redacted]w[redacted]d   1. Supervision of high risk pregnancy, antepartum  - Tdap vaccine greater than or equal to 7yo IM - 2 hour today.  - Desires TOLAC- reviewed previous delivery records. Dr. Adrian Blackwater in the room to discuss TOLAC and previous delivery. Confirmed previous low transverse @ 30 weeks D/t 0/8 BPP and abruption. She desires TOLAC.    Preterm labor symptoms and general obstetric precautions  including but not limited to vaginal bleeding, contractions, leaking of fluid and fetal movement were reviewed in detail with the patient. Please refer to After Visit Summary for other counseling recommendations.   No follow-ups on file.  Future Appointments  Date Time Provider Department Center  07/01/2019  9:15 AM [facility]INICIAN [facility]  07/15/2019 10:55 AM Marny Lowenstein, PA-C [facility]    Venia Carbon, NP

## 2019-06-24 LAB — CBC
Hematocrit: 33.5 % — ABNORMAL LOW (ref 34.0–46.6)
Hemoglobin: 11.6 g/dL (ref 11.1–15.9)
MCH: 32 pg (ref 26.6–33.0)
MCHC: 34.6 g/dL (ref 31.5–35.7)
MCV: 92 fL (ref 79–97)
Platelets: 263 10*3/uL (ref 150–450)
RBC: 3.63 x10E6/uL — ABNORMAL LOW (ref 3.77–5.28)
RDW: 12.5 % (ref 11.7–15.4)
WBC: 7.1 10*3/uL (ref 3.4–10.8)

## 2019-06-24 LAB — GLUCOSE TOLERANCE, 2 HOURS W/ 1HR
Glucose, 1 hour: 121 mg/dL (ref 65–179)
Glucose, 2 hour: 126 mg/dL (ref 65–152)
Glucose, Fasting: 82 mg/dL (ref 65–91)

## 2019-06-24 LAB — RPR: RPR Ser Ql: NONREACTIVE

## 2019-06-24 LAB — HIV ANTIBODY (ROUTINE TESTING W REFLEX): HIV Screen 4th Generation wRfx: NONREACTIVE

## 2019-07-01 ENCOUNTER — Ambulatory Visit: Payer: Medicaid Other | Admitting: Clinical

## 2019-07-01 DIAGNOSIS — Z91199 Patient's noncompliance with other medical treatment and regimen due to unspecified reason: Secondary | ICD-10-CM

## 2019-07-15 ENCOUNTER — Encounter: Payer: Medicaid Other | Admitting: Medical

## 2019-07-15 ENCOUNTER — Telehealth: Payer: Self-pay | Admitting: Medical

## 2019-07-15 NOTE — Telephone Encounter (Signed)
Attempted to reach patient about her missed appointment. Left a voicemail message for her to call the office to get rescheduled. 

## 2019-07-19 ENCOUNTER — Other Ambulatory Visit: Payer: Self-pay

## 2019-07-19 ENCOUNTER — Encounter: Payer: Self-pay | Admitting: Nurse Practitioner

## 2019-07-19 ENCOUNTER — Ambulatory Visit (INDEPENDENT_AMBULATORY_CARE_PROVIDER_SITE_OTHER): Payer: Medicaid Other | Admitting: Nurse Practitioner

## 2019-07-19 VITALS — BP 127/70 | HR 89 | Wt 234.0 lb

## 2019-07-19 DIAGNOSIS — O0993 Supervision of high risk pregnancy, unspecified, third trimester: Secondary | ICD-10-CM

## 2019-07-19 DIAGNOSIS — Z98891 History of uterine scar from previous surgery: Secondary | ICD-10-CM

## 2019-07-19 DIAGNOSIS — O0990 Supervision of high risk pregnancy, unspecified, unspecified trimester: Secondary | ICD-10-CM

## 2019-07-19 DIAGNOSIS — O26843 Uterine size-date discrepancy, third trimester: Secondary | ICD-10-CM

## 2019-07-19 DIAGNOSIS — Z3A32 32 weeks gestation of pregnancy: Secondary | ICD-10-CM

## 2019-07-19 DIAGNOSIS — Z8759 Personal history of other complications of pregnancy, childbirth and the puerperium: Secondary | ICD-10-CM

## 2019-07-19 NOTE — Progress Notes (Signed)
    Subjective:  Sherri Clark is a 29 y.o. G2P0101 at [redacted]w[redacted]d being seen today for ongoing prenatal care.  She is currently monitored for the following issues for this high-risk pregnancy and has Supervision of high risk pregnancy, antepartum; History of gonorrhea; History of cesarean delivery; BMI 29.0-29.9,adult; and History of gestational hypertension on their problem list.  Patient reports no complaints.  Contractions: Not present. Vag. Bleeding: None.  Movement: Present. Denies leaking of fluid.   The following portions of the patient's history were reviewed and updated as appropriate: allergies, current medications, past family history, past medical history, past social history, past surgical history and problem list. Problem list updated.  Objective:   Vitals:   07/19/19 1017  BP: 127/70  Pulse: 89  Weight: 234 lb (106.1 kg)    Fetal Status: Fetal Heart Rate (bpm): 136 Fundal Height: 37 cm Movement: Present     General:  Alert, oriented and cooperative. Patient is in no acute distress.  Skin: Skin is warm and dry. No rash noted.   Cardiovascular: Normal heart rate noted  Respiratory: Normal respiratory effort, no problems with respiration noted  Abdomen: Soft, gravid, appropriate for gestational age. Pain/Pressure: Present     Pelvic:  Cervical exam deferred        Extremities: Normal range of motion.     Mental Status: Normal mood and affect. Normal behavior. Normal judgment and thought content.   Urinalysis:      Assessment and Plan:  Pregnancy: G2P0101 at [redacted]w[redacted]d  1. Supervision of high risk pregnancy, antepartum Doing well and pleased her BP has not been a problem so far.  2. History of gestational hypertension taking BP at home.  Reminded to add to [facility] weekly. Reviewed signs of increasing BP with client.  3. History of cesarean delivery Signed TOLAC consent.  Reviewed with Dr. Adrian Blackwater at last visit.  4. Uterine size date discrepancy pregnancy, third  trimester Will get [place].  Baby was breech at last [place] and possibly that is the cause of size discrepancy today.  S>D - [place] MFM OB FOLLOW UP; Future  Preterm labor symptoms and general obstetric precautions including but not limited to vaginal bleeding, contractions, leaking of fluid and fetal movement were reviewed in detail with the patient. Please refer to After Visit Summary for other counseling recommendations.  Return in about 2 weeks (around 08/02/2019) for ROB.  Nolene Bernheim, RN, MSN, NP-BC Nurse Practitioner, [facility] for [employer], [facility] 07/19/2019 10:55 AM

## 2019-07-25 ENCOUNTER — Other Ambulatory Visit: Payer: Self-pay

## 2019-07-25 ENCOUNTER — Ambulatory Visit: Payer: Medicaid Other | Attending: Obstetrics and Gynecology

## 2019-07-25 ENCOUNTER — Ambulatory Visit: Payer: Medicaid Other | Admitting: *Deleted

## 2019-07-25 DIAGNOSIS — Z8759 Personal history of other complications of pregnancy, childbirth and the puerperium: Secondary | ICD-10-CM | POA: Diagnosis not present

## 2019-07-25 DIAGNOSIS — O0990 Supervision of high risk pregnancy, unspecified, unspecified trimester: Secondary | ICD-10-CM | POA: Insufficient documentation

## 2019-07-25 DIAGNOSIS — E669 Obesity, unspecified: Secondary | ICD-10-CM

## 2019-07-25 DIAGNOSIS — Z362 Encounter for other antenatal screening follow-up: Secondary | ICD-10-CM

## 2019-07-25 DIAGNOSIS — Z3A33 33 weeks gestation of pregnancy: Secondary | ICD-10-CM

## 2019-07-25 DIAGNOSIS — Z8619 Personal history of other infectious and parasitic diseases: Secondary | ICD-10-CM

## 2019-07-25 DIAGNOSIS — O09213 Supervision of pregnancy with history of pre-term labor, third trimester: Secondary | ICD-10-CM

## 2019-07-25 DIAGNOSIS — O09293 Supervision of pregnancy with other poor reproductive or obstetric history, third trimester: Secondary | ICD-10-CM

## 2019-07-25 DIAGNOSIS — O34219 Maternal care for unspecified type scar from previous cesarean delivery: Secondary | ICD-10-CM | POA: Diagnosis not present

## 2019-07-25 DIAGNOSIS — O26843 Uterine size-date discrepancy, third trimester: Secondary | ICD-10-CM | POA: Diagnosis not present

## 2019-08-01 ENCOUNTER — Encounter: Payer: Self-pay | Admitting: General Practice

## 2019-08-01 NOTE — Progress Notes (Unsigned)
Received phone call from [facility] due to patient having elevated BP of 145/80. Per chart review, patient has history of gestational HTN & has OB appt tomorrow. Placed note in appt patient may need pre-e labs per Terri.

## 2019-08-02 ENCOUNTER — Encounter: Payer: Self-pay | Admitting: Medical

## 2019-08-02 ENCOUNTER — Other Ambulatory Visit: Payer: Self-pay

## 2019-08-02 ENCOUNTER — Ambulatory Visit (INDEPENDENT_AMBULATORY_CARE_PROVIDER_SITE_OTHER): Payer: Medicaid Other | Admitting: Medical

## 2019-08-02 VITALS — BP 111/72 | HR 106 | Wt 239.5 lb

## 2019-08-02 DIAGNOSIS — Z3A34 34 weeks gestation of pregnancy: Secondary | ICD-10-CM | POA: Diagnosis not present

## 2019-08-02 DIAGNOSIS — Z98891 History of uterine scar from previous surgery: Secondary | ICD-10-CM

## 2019-08-02 DIAGNOSIS — Z8759 Personal history of other complications of pregnancy, childbirth and the puerperium: Secondary | ICD-10-CM

## 2019-08-02 DIAGNOSIS — O0990 Supervision of high risk pregnancy, unspecified, unspecified trimester: Secondary | ICD-10-CM

## 2019-08-02 DIAGNOSIS — O0993 Supervision of high risk pregnancy, unspecified, third trimester: Secondary | ICD-10-CM | POA: Diagnosis not present

## 2019-08-02 LAB — POCT URINALYSIS DIP (DEVICE)
Bilirubin Urine: NEGATIVE
Glucose, UA: NEGATIVE mg/dL
Hgb urine dipstick: NEGATIVE
Ketones, ur: NEGATIVE mg/dL
Leukocytes,Ua: NEGATIVE
Nitrite: NEGATIVE
Protein, ur: NEGATIVE mg/dL
Specific Gravity, Urine: 1.025 (ref 1.005–1.030)
Urobilinogen, UA: 0.2 mg/dL (ref 0.0–1.0)
pH: 6 (ref 5.0–8.0)

## 2019-08-02 NOTE — Patient Instructions (Addendum)
Fetal Movement Counts °Patient Name: ________________________________________________ Patient Due Date: ____________________ °What is a fetal movement count? ° °A fetal movement count is the number of times that you feel your baby move during a certain amount of time. This may also be called a fetal kick count. A fetal movement count is recommended for every pregnant woman. You may be asked to start counting fetal movements as early as week 28 of your pregnancy. °Pay attention to when your baby is most active. You may notice your baby's sleep and wake cycles. You may also notice things that make your baby move more. You should do a fetal movement count: °· When your baby is normally most active. °· At the same time each day. °A good time to count movements is while you are resting, after having something to eat and drink. °How do I count fetal movements? °1. Find a quiet, comfortable area. Sit, or lie down on your side. °2. Write down the date, the start time and stop time, and the number of movements that you felt between those two times. Take this information with you to your health care visits. °3. Write down your start time when you feel the first movement. °4. Count kicks, flutters, swishes, rolls, and jabs. You should feel at least 10 movements. °5. You may stop counting after you have felt 10 movements, or if you have been counting for 2 hours. Write down the stop time. °6. If you do not feel 10 movements in 2 hours, contact your health care provider for further instructions. Your health care provider may want to do additional tests to assess your baby's well-being. °Contact a health care provider if: °· You feel fewer than 10 movements in 2 hours. °· Your baby is not moving like he or she usually does. °Date: ____________ Start time: ____________ Stop time: ____________ Movements: ____________ °Date: ____________ Start time: ____________ Stop time: ____________ Movements: ____________ °Date: ____________  Start time: ____________ Stop time: ____________ Movements: ____________ °Date: ____________ Start time: ____________ Stop time: ____________ Movements: ____________ °Date: ____________ Start time: ____________ Stop time: ____________ Movements: ____________ °Date: ____________ Start time: ____________ Stop time: ____________ Movements: ____________ °Date: ____________ Start time: ____________ Stop time: ____________ Movements: ____________ °Date: ____________ Start time: ____________ Stop time: ____________ Movements: ____________ °Date: ____________ Start time: ____________ Stop time: ____________ Movements: ____________ °This information is not intended to replace advice given to you by your health care provider. Make sure you discuss any questions you have with your health care provider. °Document Revised: 08/19/2018 Document Reviewed: 08/19/2018 °Elsevier Patient Education © 2020 Elsevier Inc. °Braxton Hicks Contractions °Contractions of the uterus can occur throughout pregnancy, but they are not always a sign that you are in labor. You may have practice contractions called Braxton Hicks contractions. These false labor contractions are sometimes confused with true labor. °What are Braxton Hicks contractions? °Braxton Hicks contractions are tightening movements that occur in the muscles of the uterus before labor. Unlike true labor contractions, these contractions do not result in opening (dilation) and thinning of the cervix. Toward the end of pregnancy (32-34 weeks), Braxton Hicks contractions can happen more often and may become stronger. These contractions are sometimes difficult to tell apart from true labor because they can be very uncomfortable. You should not feel embarrassed if you go to the hospital with false labor. °Sometimes, the only way to tell if you are in true labor is for your health care provider to look for changes in the cervix. The health care provider   will do a physical exam and may  monitor your contractions. If you are not in true labor, the exam should show that your cervix is not dilating and your water has not broken. °If there are no other health problems associated with your pregnancy, it is completely safe for you to be sent home with false labor. You may continue to have Braxton Hicks contractions until you go into true labor. °How to tell the difference between true labor and false labor °True labor °· Contractions last 30-70 seconds. °· Contractions become very regular. °· Discomfort is usually felt in the top of the uterus, and it spreads to the lower abdomen and low back. °· Contractions do not go away with walking. °· Contractions usually become more intense and increase in frequency. °· The cervix dilates and gets thinner. °False labor °· Contractions are usually shorter and not as strong as true labor contractions. °· Contractions are usually irregular. °· Contractions are often felt in the front of the lower abdomen and in the groin. °· Contractions may go away when you walk around or change positions while lying down. °· Contractions get weaker and are shorter-lasting as time goes on. °· The cervix usually does not dilate or become thin. °Follow these instructions at home: ° °· Take over-the-counter and prescription medicines only as told by your health care provider. °· Keep up with your usual exercises and follow other instructions from your health care provider. °· Eat and drink lightly if you think you are going into labor. °· If Braxton Hicks contractions are making you uncomfortable: °? Change your position from lying down or resting to walking, or change from walking to resting. °? Sit and rest in a tub of warm water. °? Drink enough fluid to keep your urine pale yellow. Dehydration may cause these contractions. °? Do slow and deep breathing several times an hour. °· Keep all follow-up prenatal visits as told by your health care provider. This is important. °Contact a  health care provider if: °· You have a fever. °· You have continuous pain in your abdomen. °Get help right away if: °· Your contractions become stronger, more regular, and closer together. °· You have fluid leaking or gushing from your vagina. °· You pass blood-tinged mucus (bloody show). °· You have bleeding from your vagina. °· You have low back pain that you never had before. °· You feel your baby’s head pushing down and causing pelvic pressure. °· Your baby is not moving inside you as much as it used to. °Summary °· Contractions that occur before labor are called Braxton Hicks contractions, false labor, or practice contractions. °· Braxton Hicks contractions are usually shorter, weaker, farther apart, and less regular than true labor contractions. True labor contractions usually become progressively stronger and regular, and they become more frequent. °· Manage discomfort from Braxton Hicks contractions by changing position, resting in a warm bath, drinking plenty of water, or practicing deep breathing. °This information is not intended to replace advice given to you by your health care provider. Make sure you discuss any questions you have with your health care provider. °Document Revised: 12/12/2016 Document Reviewed: 05/15/2016 °Elsevier Patient Education © 2020 Elsevier Inc. ° °AREA PEDIATRIC/FAMILY PRACTICE PHYSICIANS ° °[place] ([address]) °• [facility] °o Chambliss, MD; Eniola, MD; Hale, MD; Hensel, MD; McDiarmid, MD; McIntyer, MD; Neal, MD; Walden, MD °o [address] [place], [place] [address] °o [phone] °o Mon-Fri 8:30-12:30, 1:30-5:00 °o Providers come to see babies   at [facility] °o Accepting Medicaid °• [facility] °o Limited providers who accept newborns: Koirala, MD; Morrow, MD; Wolters, MD °o [address], [place], [place] [address] °o [phone] °o Mon-Fri 8:00-5:30 °o Babies seen by providers at  [facility] °o Does NOT accept Medicaid °o Please call early in hospitalization for appointment (limited availability)  °• [facility] °o Mulberry, MD °o [address] [place], [place] [address] °o [phone] °o Mon, Tue, Thur, Fri 8:30-5:00, Wed 10:00-7:00 (closed 1-2pm) °o Babies seen by [facility] providers °o Accepting Medicaid °• [facility] °o Rubin, MD °o [address], [place], [place] [address] °o [phone] °o Mon-Fri 8:30-5:00, Sat 8:30-12:00 °o Provider comes to see babies at [facility] °o Accepting Medicaid °o Must have been referred from current patients or contacted office prior to delivery °• [facility] ([facility]) °o Brown, MD; Chandler, MD; Ettefagh, MD; Grant, MD; Lester, MD; McCormick, MD; McQueen, MD; Prose, MD; Simha, MD; Stanley, MD; Stryffeler, NP; Tebben, NP °o [address], [place], [place] [address] °o [phone] °o Mon, Tue, Thur, Fri 8:30-5:30, Wed 9:30-5:30, Sat 8:30-12:30 °o Babies seen by [facility] providers °o Accepting Medicaid °o Only accepting infants of first-time parents or siblings of current patients °o Hospital discharge coordinator will make follow-up appointment °• Jack Amos °o [address], [place], [place]  [address] °o [phone]   Fax - [phone] °• [facility] °o [address], [place], [place]  [address] °o Phone - [phone]   Fax - [phone] °• Shilpa Gosrani °o [address], [place], [place]  [address] °o [phone] ° °[place] ([address]) °• [facility] °o Bates, MD; Brassfield, MD; Cooper, Cox, MD; MD; Davis, MD; Dovico, MD; Ettefaugh, MD; Little, MD; Lowe, MD; Keiffer, MD; Melvin, MD; Sumner, MD; Williams, MD °o [address], [place], [place] [address] °o [phone] °o Mon-Fri 8:30-5:00 (extended evenings Mon-Thur as needed), Sat-Sun  10:00-1:00 °o Providers come to see babies at [facility] °o Accepting Medicaid for families of first-time babies and families with all children in the household age 3 and under. Must register with office prior to making appointment (M-F only). °• [facility] °o Henson, NP; Knapp, MD; Lalonde, MD; Tysinger, PA °o [address] [place], [place] [address] °o [phone] °o Mon-Fri 8:00-5:00 °o Babies seen by providers at [facility] °o Does NOT accept Medicaid/Commercial Insurance Only °• [facility] ([facility]  °o Artis, MD; Barnes, MD; Bratton, MD; Coccaro, MD; Lockett Gardner, MD; Kramer, MD; Marshall, MD; Netherton, MD; Poleto, MD; Skinner, MD °o [address] [place], [place] [address] °o [phone] °o Mon-Fri 8:30-5:30, Sat (Oct.-Mar.) 9:00-1:00 °o Babies seen by providers at [facility] °o Accepting Medicaid ° °[place] ([address]) °• [facility] °o Reid, MD; Warner, MD °o [address], [place],  [address] °o [phone] °o Mon-Fri 8:30-5:00, Sat 8:30-12:00 °o Providers come to see babies at [facility] °o Does NOT accept Medicaid °• [facility] °o Becker, PA; Hagler, MD; Scifres, PA; Sun, MD; Swayne, MD °o [address], [place],  [address] °o [phone] °o Mon-Fri 8:00-5:00 °o Babies seen by providers at [facility] °o Does NOT accept Medicaid °o Only accepting babies of parents who are patients °o Please call   early in hospitalization for appointment (limited availability) °• [facility] °o Clark, MD; Frye, MD; Kelleher, MD; Mack, NP; Miller, MD; O'Keller, MD; Patterson, NP; Pudlo, MD; Puzio, MD; Thomas, MD; Tucker, MD; Twiselton, MD °o [address], [place], [place] [address] °o [phone] °o Mon-Fri 8:00-5:00, Sat 9:00-12:00 °o Providers come to see babies at [facility] °o Does NOT accept  Medicaid ° °[place] ([address]) °• [facility] °o Limited providers accepting new patients: Brake, NP; Wharton, PA °o [address], [place], [place] [address] °o [phone] °o Mon-Fri 8:00-5:00 °o Babies seen by providers at [facility] °o Does NOT accept Medicaid °o Only accepting babies of parents who are patients °o Please call early in hospitalization for appointment (limited availability) °• [facility] °o Gay, MD; Quinlan, MD °o [address] [place], [place] [address] °o [phone] (press 1 to schedule appointment) °o Mon-Fri 8:00-5:00 °o Providers come to see babies at [facility] °o Does NOT accept Medicaid °• [facility] °o Mazer, MD °o [address] [place], [place] [address] °o [phone] °o Mon-Fri 8:30-5:00 (lunch 12:30-1:00), extended hours by appointment only Wed 5:00-6:30 °o Babies seen by [facility] providers °o Accepting Medicaid °• [facility] °o Banks, MD; Jordan, MD; Koberlein, MD °o [address], [place], [place] [address] °o [phone] °o Mon-Fri 8:00-5:00 °o Babies seen by [facility] providers °o Does NOT accept Medicaid °• [facility] °o Parker, MD; Hunter, MD; Wallace, DO °o [address] [place], [place] [address] °o [phone] °o Mon-Fri 8:00-5:00 °o Babies seen by [facility] providers °o Does NOT accept Medicaid °• [facility] °o Brandon, PA; Brecken, PA; Christy, NP; Dees, MD; DeClaire, MD; DeWeese, MD; Hansen, NP; Mills, NP; Parrish, NP; Smoot, NP; Summer, MD; Vapne, MD °o [address] [place], [place] [address] °o [phone] °o Mon-Fri 8:30-5:00, Sat 10:00-1:00 °o Providers come to see babies at [facility] °o Does NOT accept Medicaid °o Free prenatal information session Tuesdays at 4:45pm °• [facility] °o Bouska, MD; Gordon, PA; Jeffery, PA; Weber, PA °o [address] [place] [address] °o [phone] °o Mon-Fri 7:30-5:30 °o Babies seen by [facility] providers °• [facility] °o [address], [place], [place]  [address] °o [phone]   Fax - [phone] ° °[place] ([address]) °• [facility] °o Reese, MD °o [address] [place], [place] [address] °o [phone] °o Mon-Thur 8:00-6:00 °o Providers come to see babies at [facility] °o Accepting Medicaid °• [facility] °o Anderson, NP; Badger, MD; Beal, PA; Spencer, PA °o [address] [place], [place] [address] °o [phone] °o Mon-Thur 7:30-7:30, Fri 7:30-4:30 °o Babies seen by [facility] providers °o Accepting Medicaid °• [facility] °o Agbuya, MD; Klett, NP; Romgoolam, MD °o [address], [place],  [address] °o [phone] °o Mon-Fri 8:30-5:00, Sat 8:30-12:00 °o Providers come to see babies at [facility] °o Accepting Medicaid °o Must have “Meet & Greet” appointment at office prior to delivery °• [facility] ([facility] °o McCord, MD; Wallace, MD; Wood, MD °o [address], [place],  [address] °o [phone] °o Mon-Wed 8:00-6:00, Thur-Fri 8:00-5:00, Sat 9:00-12:00 °o Providers come to see babies at [facility] °o Does NOT accept Medicaid °o Only accepting siblings of current patients °• [facility]  °  o [address], [place], [place]  [address] °o [phone]   Fax - [phone] °• [facility] °o [address], [place], [place]  [address] °o [phone]   Fax - [phone] ° °[place] ([address]) °• [facility] °o Cirigliano, DO; Matthews, DO °o [address] [place], [place] [address] °o [phone] °o Mon-Fri 7:00-5:00 °o Babies seen by [facility] providers °o Does NOT  accept Medicaid °• [facility] °o Briscoe, MD; Howley, PA; Moreira, PA °o [address], [place], [place] [address] °o [phone] °o Mon-Fri 8:00-5:00 °o Babies seen by [facility] providers °o Accepting Medicaid °• [facility] °o Boyd, MD; Church, PA; Jones, NP; Osborn, PA °o [address], , [place] [address] °o [phone] °o Mon-Fri 8:00-5:00 °o Babies seen by providers at [facility] °o Accepting Medicaid ° °[place] ([address]) °• [facility] [place] °o Wendling, DO °o [address] [place], [place] [address] °o [phone] °o Mon-Fri 8:00-5:00 °o Babies seen by [facility] providers °o Does NOT accept Medicaid °o Limited availability, please call early in hospitalization to schedule follow-up °• [facility] °o Calderon, PA; Cummings, MD; Dillard, MD; Martin, PA; Olson, MD; VanDeven, PA °o [address], [place], [place] [address] °o [phone] °o Mon-Fri 8:30-5:00, Sat 9:00-12:00 °o Babies seen by providers at [facility] °o Accepting Medicaid °o Please register online then schedule online or call office °o [email or web address] °• [facility] ([facility] °o Hunter, NP; Kumar, MD; Martin Rogers, PA °o [address], [place], [place] [address] °o [phone] °o Mon-Fri 8:00-5:00 °o Babies seen by providers at [facility] °o Accepting Medicaid °• [facility] ([facility]) °o Newport, MD; Kristi Fleenor, NP; West, MD °o [address], [place], [place] [address] °o [phone] °o Mon-Fri 8:00-5:30, Sat&Sun by appointment (phones open at 8:30) °o Babies seen by [facility] providers °o Accepting Medicaid °o Must be a first-time baby or sibling of current patient °• [facility] [place]  °o [address], [place], [place]  [address] °o [phone]   Fax - [phone] ° °[place] ([address]) °• [facility] °o Brown, PA; Cowen, PA; Rice, MD; Helton, PA; Spry, MD °o [address] [place], [place] [address] °o [phone] °o Mon-Thur 8:00-7:00, Fri 8:00-5:00, Sat 8:00-12:00, Sun 9:00-12:00 °o Babies seen by [facility] providers °o Accepting Medicaid °• [facility] °o Coe-Goins, MD; Marshall, MD; Pierre-Louis, MD °o [address], [place], [place] [address] °o [phone] °o Mon-Thur 8:00-5:00 °o Babies seen by providers at [facility] °o Accepting Medicaid °• [facility] °o Bratton, MD; Coe-Goins, MD; Hayes, MD; Lewis, MD; List, MD; Lott, MD; Marshall, MD; Moran, MD; O'Neal, MD; Pierre-Louis, MD; Pitonzo, MD; Scholer, MD; Spangle, MD °o [address] [place], [place] [address] °o [phone] °o Mon-Fri 8:00-5:30, Sat (Oct.-Mar.) 9:00-1:00 °o Babies seen by providers at [facility] °o Accepting Medicaid °o Must fill out new patient packet, available online at [email or web address] °• [facility] ([facility]) °o Friddle, NP; Harris, NP; Kelly, NP; Logan,   MD; Melvin, PA; Poth, MD; Ramadoss, MD; Stanton, NP °o [address], [place], [place] [address] °o [phone] °o Mon-Thur 8:00-5:30, Fri 8:00-5:00 °o Babies seen by providers at [facility] °o Accepting Medicaid ° °[place] ([address]) °• [facility] °o Dixon, PA; Lake, MD; Pickard, MD; Tapia, PA °o [address], [place], [place] [address] °o [phone] °o Mon-Fri 8:00-5:00 °o Babies seen by providers at [facility] °o Accepting Medicaid  ° °[place] ([address]) °• [facility] [place] °o Masneri, DO; Meyers, MD; Nelson, PA °o [address], [place], [place]  [address] °o [phone] °o Mon-Fri 8:00-5:00 °o Babies seen by providers at [facility] °o Does NOT accept Medicaid °o Limited appointment availability, please call early in hospitalization ° °• [facility] [place] °o Kunedd, DO; McGowen, MD °o [address], [place], [place] [address] °o [phone] °o Mon-Fri 8:00-5:00 °o Babies seen by [facility] providers °o Does NOT accept Medicaid °• [facility] [place] °o Cameron, MD; MacDonald, MD; Michaels, PA; Nayak, MD °o [address] [place] [address], [place], [place] [address] °o [phone] °o Mon-Fri 8:00-5:00 °o After hours clinic ([address] [place], [place] [address]) [phone] Mon-Fri 5:00-8:00, Sat 12:00-6:00, Sun 10:00-4:00 °o Babies seen by [facility] providers °o Accepting Medicaid °• [facility] [place] °o [address], [place], [place]  [address] °o [phone]   Fax - [phone] ° °[place] ([address]) °• [facility] [place] °o Andy, MD °o [address], [place], [place] [address] °o [phone] °o Mon-Fri 8:00-5:00 °o Babies seen by [facility] providers °o Does NOT accept Medicaid °• [facility] [place] ([facility] [place]) °o Eksir, MD °o [address], [place],  [address] °o [phone] °o Mon-Thur 8:00-7:00, Fri 8:00-5:00, Sat 8:00-12:00 °o Babies seen by providers at [facility] °o Accepting Medicaid - but does not have vaccinations in office (must be received elsewhere) °o Limited availability, please call early in hospitalization ° °[place] ([address]) °• [facility]  °o Charlene Flemming, MD °o [address], [place]  [address] °o [phone]  Fax [phone] ° ° °

## 2019-08-02 NOTE — Progress Notes (Signed)
   PRENATAL VISIT NOTE  Subjective:  Sherri Clark is a 29 y.o. G2P0101 at [redacted]w[redacted]d being seen today for ongoing prenatal care.  She is currently monitored for the following issues for this high-risk pregnancy and has Supervision of high risk pregnancy, antepartum; History of gonorrhea; History of cesarean delivery; BMI 29.0-29.9,adult; and History of gestational hypertension on their problem list.  Patient reports no complaints.  Contractions: Not present. Vag. Bleeding: None.  Movement: Present. Denies leaking of fluid.   The following portions of the patient's history were reviewed and updated as appropriate: allergies, current medications, past family history, past medical history, past social history, past surgical history and problem list.   Objective:   Vitals:   08/02/19 1143  BP: 111/72  Pulse: (!) 106  Weight: 239 lb 8 oz (108.6 kg)    Fetal Status: Fetal Heart Rate (bpm): 138   Movement: Present     General:  Alert, oriented and cooperative. Patient is in no acute distress.  Skin: Skin is warm and dry. No rash noted.   Cardiovascular: Normal heart rate noted  Respiratory: Normal respiratory effort, no problems with respiration noted  Abdomen: Soft, gravid, appropriate for gestational age.  Pain/Pressure: Absent     Pelvic: Cervical exam deferred        Extremities: Normal range of motion.  Edema: Trace  Mental Status: Normal mood and affect. Normal behavior. Normal judgment and thought content.   Assessment and Plan:  Pregnancy: G2P0101 at [redacted]w[redacted]d 1. Supervision of high risk pregnancy, antepartum - Doing well - Peds list given and importance of choosing peds by 36 weeks reviewed  - Anticipatory guidance for upcoming visits discussed   2. History of gestational hypertension - Normotensive today, patient having higher values with home cuff  4. History of cesarean delivery - Planning TOLAC, form signed previously   Preterm labor symptoms and general obstetric  precautions including but not limited to vaginal bleeding, contractions, leaking of fluid and fetal movement were reviewed in detail with the patient. Please refer to After Visit Summary for other counseling recommendations.   Return in about 2 weeks (around 08/16/2019) for LOB, In-Person, any provider.  Future Appointments  Date Time Provider Department Center  08/16/2019  2:55 PM Marylene Land, CNM [facility]    Vonzella Nipple, PA-C

## 2019-08-10 ENCOUNTER — Other Ambulatory Visit: Payer: Self-pay

## 2019-08-10 ENCOUNTER — Encounter: Payer: Self-pay | Admitting: General Practice

## 2019-08-10 ENCOUNTER — Encounter (HOSPITAL_COMMUNITY): Payer: Self-pay | Admitting: Emergency Medicine

## 2019-08-10 ENCOUNTER — Inpatient Hospital Stay (HOSPITAL_COMMUNITY)
Admission: EM | Admit: 2019-08-10 | Discharge: 2019-08-10 | Disposition: A | Discharge status: Home / Self Care | Payer: Medicaid Other | Attending: Obstetrics and Gynecology | Admitting: Obstetrics and Gynecology

## 2019-08-10 DIAGNOSIS — Z8619 Personal history of other infectious and parasitic diseases: Secondary | ICD-10-CM

## 2019-08-10 DIAGNOSIS — O368130 Decreased fetal movements, third trimester, not applicable or unspecified: Secondary | ICD-10-CM | POA: Insufficient documentation

## 2019-08-10 DIAGNOSIS — O133 Gestational [pregnancy-induced] hypertension without significant proteinuria, third trimester: Secondary | ICD-10-CM | POA: Insufficient documentation

## 2019-08-10 DIAGNOSIS — Z87891 Personal history of nicotine dependence: Secondary | ICD-10-CM | POA: Diagnosis not present

## 2019-08-10 DIAGNOSIS — R519 Headache, unspecified: Secondary | ICD-10-CM | POA: Insufficient documentation

## 2019-08-10 DIAGNOSIS — Z3A35 35 weeks gestation of pregnancy: Secondary | ICD-10-CM | POA: Diagnosis not present

## 2019-08-10 DIAGNOSIS — O09293 Supervision of pregnancy with other poor reproductive or obstetric history, third trimester: Secondary | ICD-10-CM | POA: Diagnosis not present

## 2019-08-10 DIAGNOSIS — Z9049 Acquired absence of other specified parts of digestive tract: Secondary | ICD-10-CM | POA: Insufficient documentation

## 2019-08-10 DIAGNOSIS — O26893 Other specified pregnancy related conditions, third trimester: Secondary | ICD-10-CM | POA: Diagnosis not present

## 2019-08-10 DIAGNOSIS — O0990 Supervision of high risk pregnancy, unspecified, unspecified trimester: Secondary | ICD-10-CM

## 2019-08-10 DIAGNOSIS — Z98891 History of uterine scar from previous surgery: Secondary | ICD-10-CM | POA: Insufficient documentation

## 2019-08-10 DIAGNOSIS — Z8249 Family history of ischemic heart disease and other diseases of the circulatory system: Secondary | ICD-10-CM | POA: Insufficient documentation

## 2019-08-10 DIAGNOSIS — Z8759 Personal history of other complications of pregnancy, childbirth and the puerperium: Secondary | ICD-10-CM | POA: Diagnosis not present

## 2019-08-10 HISTORY — DX: Gestational (pregnancy-induced) hypertension without significant proteinuria, unspecified trimester: O13.9

## 2019-08-10 LAB — CBC
HCT: 36.3 % (ref 36.0–46.0)
Hemoglobin: 12.3 g/dL (ref 12.0–15.0)
MCH: 31.9 pg (ref 26.0–34.0)
MCHC: 33.9 g/dL (ref 30.0–36.0)
MCV: 94 fL (ref 80.0–100.0)
Platelets: 241 10*3/uL (ref 150–400)
RBC: 3.86 MIL/uL — ABNORMAL LOW (ref 3.87–5.11)
RDW: 13.1 % (ref 11.5–15.5)
WBC: 6.5 10*3/uL (ref 4.0–10.5)
nRBC: 0 % (ref 0.0–0.2)

## 2019-08-10 LAB — COMPREHENSIVE METABOLIC PANEL
ALT: 18 U/L (ref 0–44)
AST: 18 U/L (ref 15–41)
Albumin: 2.4 g/dL — ABNORMAL LOW (ref 3.5–5.0)
Alkaline Phosphatase: 113 U/L (ref 38–126)
Anion gap: 9 (ref 5–15)
BUN: 5 mg/dL — ABNORMAL LOW (ref 6–20)
CO2: 22 mmol/L (ref 22–32)
Calcium: 9 mg/dL (ref 8.9–10.3)
Chloride: 105 mmol/L (ref 98–111)
Creatinine, Ser: 0.58 mg/dL (ref 0.44–1.00)
GFR calc Af Amer: >60 mL/min (ref >60)
GFR calc non Af Amer: >60 mL/min (ref >60)
Glucose, Bld: 109 mg/dL — ABNORMAL HIGH (ref 70–99)
Potassium: 3.8 mmol/L (ref 3.5–5.1)
Sodium: 136 mmol/L (ref 135–145)
Total Bilirubin: 0.4 mg/dL (ref 0.3–1.2)
Total Protein: 5.6 g/dL — ABNORMAL LOW (ref 6.5–8.1)

## 2019-08-10 LAB — URINALYSIS, ROUTINE W REFLEX MICROSCOPIC
Bilirubin Urine: NEGATIVE
Glucose, UA: NEGATIVE mg/dL
Hgb urine dipstick: NEGATIVE
Ketones, ur: NEGATIVE mg/dL
Leukocytes,Ua: NEGATIVE
Nitrite: NEGATIVE
Protein, ur: NEGATIVE mg/dL
Specific Gravity, Urine: 1.008 (ref 1.005–1.030)
pH: 7 (ref 5.0–8.0)

## 2019-08-10 LAB — PROTEIN / CREATININE RATIO, URINE
Creatinine, Urine: 59.53 mg/dL
Total Protein, Urine: <6 mg/dL

## 2019-08-10 MED ORDER — ACETAMINOPHEN 325 MG PO TABS
650.0000 mg | ORAL_TABLET | Freq: Once | ORAL | Status: AC
  Administered 2019-08-10: 650 mg via ORAL
  Filled 2019-08-10: qty 2

## 2019-08-10 NOTE — MAU Note (Addendum)
Pt transferred from ED with headache since Monday. Took Tylenol, last dose about 1500 today. Also seeing "black spots."  Took BP at home and it was 150/89. Also reports decreased fetal movement today.

## 2019-08-10 NOTE — ED Triage Notes (Addendum)
Pt reports 2 days ago she developed a headache and felt like she was seeing black spots. Pt states this morning when she got out of bed she had a headache and felt like the spots were more constant. Pt is [redacted] weeks pregnant and also has not felt her baby move today. No vaginal bleeding and no leaking fluid.  Pt is G2P1 pt reports this happened with previous baby and ended up getting ER C section.

## 2019-08-10 NOTE — ED Provider Notes (Signed)
MSE was initiated and I personally evaluated the patient and placed orders (if any) at  8:07 PM on August 10, 2019.  Sherri Clark is a 29 y.o. female who is currently [redacted] weeks pregnant presents complaining of headache and seeing black spots in her vision for the past 2 days, she has not been diagnosed with hypertension or preeclampsia during her current pregnancy but had a history of it with her previous pregnancy.  She also reports that she has not felt fetal movement since last night.  Sent in a message to her OB/GYN today and was told to come in for evaluation.  She has not had any vaginal bleeding or leakage of fluids.  Has had some lower abdominal pressure but no cramping or contraction-like pain.  On arrival she is normotensive and all other vitals are normal, she is in no acute distress.  Case discussed with Dr. Jolayne Panther who accepts pt to MAU for further evaluation  The patient appears stable so that the remainder of the MSE may be completed by another provider.    Dartha Lodge, PA-C 08/10/19 2018    Geoffery Lyons, MD 08/11/19 1640

## 2019-08-10 NOTE — MAU Provider Note (Addendum)
Chief Complaint:  Headache and Decreased Fetal Movement  HPI: Sherri Clark is a 29 y.o. G2P0101 at 66w3dby L/6 who presents to maternity admissions reporting concern of HA and "spots in vision" since 7/26 in addition to decreased fetal movement since last night. Pt reports taking her BP at home last night in the setting of "belly tightness" at which time it was 150/89. No repeat BP checks at home last night or today. She describes her HA as a constant, "pressure-like sensation" and rated 8/10. No associated photophobia or blurred vision but "spots in vision" as noted in addition to sinus congestion. No reported tobacco or substance use. No headaches reported in pregnancy prior to 7/26. She is only taking prenatal vitamins; she hasn't taken ASA for the past several weeks. Pt reports history of prior emergent C/S at 30 weeks in the setting of preeclampsia and placental abruption. Current pregnancy complicated by gHTN. Plan for TOLAC.  She denies LOF, vaginal bleeding, vaginal itching/burning, urinary symptoms, h/a, n/v, or fever/chills.  HPI  Past Medical History: Past Medical History:  Diagnosis Date   Gonorrhea 2013   Pregnancy induced hypertension     Past obstetric history: OB History  Gravida Para Term Preterm AB Living  _0 SAB TAB Ectopic Multiple Live Births          1    # Outcome Date GA Lbr Len/2nd Weight Sex Delivery Anes PTL Lv  2 Current           1 Preterm 01/14/10 [id number] g  CS-Unspec EPI  LIV     Birth Comments: PTD; c/s fetal distress     Past Surgical History: Past Surgical History:  Procedure Laterality Date   CESAREAN SECTION     CHOLECYSTECTOMY     EYE SURGERY      Family History: Family History  Problem Relation Age of Onset   Lupus Mother    Hypertension Father     Social History: Social History   Tobacco Use   Smoking status: Former Smoker    Types: Cigarettes    Quit date: 01/30/2017    Years since quitting: 2.5    Smokeless tobacco: Never Used  Vaping Use   Vaping Use: Never used  Substance Use Topics   Alcohol use: Never   Drug use: Never    Allergies: No Known Allergies  Meds:  Medications Prior to Admission  Medication Sig Dispense Refill Last Dose   acetaminophen (TYLENOL) 500 MG tablet Take 500 mg by mouth every 6 (six) hours as needed for mild pain or moderate pain.   08/10/2019 at Unknown time   Blood Pressure Monitoring (BLOOD PRESSURE KIT) DEVI 1 Device by Does not apply route as needed. 1 each 0 08/10/2019 at Unknown time   Prenatal Vit-Fe Fumarate-FA (PREPLUS) 27-1 MG TABS Take 1 tablet by mouth daily. 30 tablet 6 08/10/2019 at Unknown time    ROS:  Review of Systems  Constitutional: Positive for fatigue. Negative for fever.  HENT: Positive for congestion and sinus pressure. Negative for sore throat.   Eyes: Positive for visual disturbance. Negative for photophobia.  Respiratory: Negative for cough, chest tightness and shortness of breath.   Cardiovascular: Negative for chest pain and leg swelling.  Gastrointestinal: Negative for abdominal pain, nausea and vomiting.  Genitourinary: Negative for dysuria, pelvic pain, vaginal bleeding, vaginal discharge and vaginal pain.  Neurological: Positive for light-headedness and headaches. Negative for seizures.   I have  reviewed patient's Past Medical Hx, Surgical Hx, Family Hx, Social Hx, medications and allergies.   Physical Exam   Patient Vitals for the past 24 hrs:  BP Temp Temp src Pulse Resp SpO2 Height Weight  08/10/19 2115 109/78 -- -- 97 -- -- -- --  08/10/19 2105 -- -- -- -- -- 99 % -- --  08/10/19 2100 -- -- -- -- -- 99 % -- --  08/10/19 2054 116/73 98.3 F (36.8 C) Oral 95 18 99 % -- --  08/10/19 2043 -- -- -- -- -- -- 5' 6" (1.676 m) (!) 110.3 kg  08/10/19 1951 123/81 98.2 F (36.8 C) Oral 101 16 100 % -- --   Constitutional: Well-developed, well-nourished female in no acute distress.  Cardiovascular: normal  rate Respiratory: normal effort GI: Abd soft, non-tender, gravid appropriate for gestational age.  MS: Extremities nontender, no edema, normal ROM Neurologic: Alert and oriented x 4.  GU: Neg CVAT.  PELVIC EXAM: external genitalia normal Bimanual exam: Cervix 0/long/high, soft, posterior, neg CMT, uterus nontender, nonenlarged, adnexa without tenderness, enlargement, or mass  Dilation: Closed Effacement (%): Thick Cervical Position: Posterior Exam by:: Dr Astrid Drafts  FHT:  Baseline 140, moderate variability, accelerations present, no decelerations Contractions: intermittent   Labs: Results for orders placed or performed during the hospital encounter of 08/10/19 (from the past 24 hour(s))  Urinalysis, Routine w reflex microscopic     Status: None   Collection Time: 08/10/19  9:05 PM  Result Value Ref Range   Color, Urine YELLOW YELLOW   APPearance CLEAR CLEAR   Specific Gravity, Urine 1.008 1.005 - 1.030   pH 7.0 5.0 - 8.0   Glucose, UA NEGATIVE NEGATIVE mg/dL   Hgb urine dipstick NEGATIVE NEGATIVE   Bilirubin Urine NEGATIVE NEGATIVE   Ketones, ur NEGATIVE NEGATIVE mg/dL   Protein, ur NEGATIVE NEGATIVE mg/dL   Nitrite NEGATIVE NEGATIVE   Leukocytes,Ua NEGATIVE NEGATIVE   O/Positive/-- (02/15 0321)  Imaging:    MAU Course/MDM: Orders Placed This Encounter  Procedures   Urinalysis, Routine w reflex microscopic   CBC   Comprehensive metabolic panel   Protein / creatinine ratio, urine    Meds ordered this encounter  Medications   acetaminophen (TYLENOL) tablet 650 mg    2150: pt reports HA nearly resolved s/p dose of tylenol in MAU.  NST reviewed, reactive Treatments in MAU included pelvic exam, HELLP labs, tylenol for HA.    Assessment: 1. Acute nonintractable headache, unspecified headache type   2. History of gestational hypertension   3. Supervision of high risk pregnancy, antepartum   4. History of gonorrhea   5. Decreased fetal movements in third  trimester, single or unspecified fetus    Plan: 2110: BPs all normal in MAU and NST reactive. Will plan for HELLP labs and tylenol for HA.  2200: Signed out to Hansel Feinstein, CNM and updated pt with plan. Given HA resolved s/p dose of tylenol plan to f/u HELLP lab results prior to decision of admit vs discharge home with strict preeclampsia, preterm labor precautions. May consider BMZ given pt's GA and history of emergent C/S in setting of preeclampsia and placental abruption.  Allergies as of 08/10/2019   No Known Allergies    Guillermina City, MD [facility], [facility] 08/10/2019 9:58 PM   Results for orders placed or performed during the hospital encounter of 08/10/19 (from the past 24 hour(s))  Urinalysis, Routine w reflex microscopic     Status: None   Collection Time: 08/10/19  9:05 PM  Result Value Ref Range   Color, Urine YELLOW YELLOW   APPearance CLEAR CLEAR   Specific Gravity, Urine 1.008 1.005 - 1.030   pH 7.0 5.0 - 8.0   Glucose, UA NEGATIVE NEGATIVE mg/dL   Hgb urine dipstick NEGATIVE NEGATIVE   Bilirubin Urine NEGATIVE NEGATIVE   Ketones, ur NEGATIVE NEGATIVE mg/dL   Protein, ur NEGATIVE NEGATIVE mg/dL   Nitrite NEGATIVE NEGATIVE   Leukocytes,Ua NEGATIVE NEGATIVE  Protein / creatinine ratio, urine     Status: None   Collection Time: 08/10/19  9:28 PM  Result Value Ref Range   Creatinine, Urine 59.53 mg/dL   Total Protein, Urine <6 mg/dL   Protein Creatinine Ratio        0.00 - 0.15 mg/mg[Cre]  CBC     Status: Abnormal   Collection Time: 08/10/19  9:36 PM  Result Value Ref Range   WBC 6.5 4.0 - 10.5 K/uL   RBC 3.86 (L) 3.87 - 5.11 MIL/uL   Hemoglobin 12.3 12.0 - 15.0 g/dL   HCT 36.3 36 - 46 %   MCV 94.0 80.0 - 100.0 fL   MCH 31.9 26.0 - 34.0 pg   MCHC 33.9 30.0 - 36.0 g/dL   RDW 13.1 11.5 - 15.5 %   Platelets 241 150 - 400 K/uL   nRBC 0.0 0.0 - 0.2 %  Comprehensive metabolic panel     Status: Abnormal   Collection Time: 08/10/19  9:36 PM  Result Value  Ref Range   Sodium 136 135 - 145 mmol/L   Potassium 3.8 3.5 - 5.1 mmol/L   Chloride 105 98 - 111 mmol/L   CO2 22 22 - 32 mmol/L   Glucose, Bld 109 (H) 70 - 99 mg/dL   BUN 5 (L) 6 - 20 mg/dL   Creatinine, Ser 0.58 0.44 - 1.00 mg/dL   Calcium 9.0 8.9 - 10.3 mg/dL   Total Protein 5.6 (L) 6.5 - 8.1 g/dL   Albumin 2.4 (L) 3.5 - 5.0 g/dL   AST 18 15 - 41 U/L   ALT 18 0 - 44 U/L   Alkaline Phosphatase 113 38 - 126 U/L   Total Bilirubin 0.4 0.3 - 1.2 mg/dL   GFR calc non Af Amer >60 >60 mL/min   GFR calc Af Amer >60 >60 mL/min   Anion gap 9 5 - 15   Vitals:   08/10/19 2115 08/10/19 2230 08/10/19 2247 08/10/19 2253  BP: 109/78   120/69  Pulse: 97     Resp:      Temp:      TempSrc:      SpO2:  97% 99%   Weight:      Height:        Discussed normal labs  Normotensive throughout Will discharge home  A:  Single iup at [redacted]w[redacted]d      Headache       Decreased fetal movement       Reactive fetal heart rate tracing  P   Discharge home       Monitor fetal movement       Labor precautions      followup  In office as scheduled . Encouraged to return here or to other Urgent Care/ED if she develops worsening of symptoms, increase in pain, fever, or other concerning symptoms.    WSeabron Spates CNM

## 2019-08-10 NOTE — Discharge Instructions (Signed)
Third Trimester of Pregnancy The third trimester is from week 28 through week 40 (months 7 through 9). The third trimester is a time when the unborn baby (fetus) is growing rapidly. At the end of the ninth month, the fetus is about 20 inches in length and weighs 6-10 pounds. Body changes during your third trimester Your body will continue to go through many changes during pregnancy. The changes vary from woman to woman. During the third trimester:  Your weight will continue to increase. You can expect to gain 25-35 pounds (11-16 kg) by the end of the pregnancy.  You may begin to get stretch marks on your hips, abdomen, and breasts.  You may urinate more often because the fetus is moving lower into your pelvis and pressing on your bladder.  You may develop or continue to have heartburn. This is caused by increased hormones that slow down muscles in the digestive tract.  You may develop or continue to have constipation because increased hormones slow digestion and cause the muscles that push waste through your intestines to relax.  You may develop hemorrhoids. These are swollen veins (varicose veins) in the rectum that can itch or be painful.  You may develop swollen, bulging veins (varicose veins) in your legs.  You may have increased body aches in the pelvis, back, or thighs. This is due to weight gain and increased hormones that are relaxing your joints.  You may have changes in your hair. These can include thickening of your hair, rapid growth, and changes in texture. Some women also have hair loss during or after pregnancy, or hair that feels dry or thin. Your hair will most likely return to normal after your baby is born.  Your breasts will continue to grow and they will continue to become tender. A yellow fluid (colostrum) may leak from your breasts. This is the first milk you are producing for your baby.  Your belly button may stick out.  You may notice more swelling in your hands,  face, or ankles.  You may have increased tingling or numbness in your hands, arms, and legs. The skin on your belly may also feel numb.  You may feel short of breath because of your expanding uterus.  You may have more problems sleeping. This can be caused by the size of your belly, increased need to urinate, and an increase in your body's metabolism.  You may notice the fetus "dropping," or moving lower in your abdomen (lightening).  You may have increased vaginal discharge.  You may notice your joints feel loose and you may have pain around your pelvic bone. What to expect at prenatal visits You will have prenatal exams every 2 weeks until week 36. Then you will have weekly prenatal exams. During a routine prenatal visit:  You will be weighed to make sure you and the baby are growing normally.  Your blood pressure will be taken.  Your abdomen will be measured to track your baby's growth.  The fetal heartbeat will be listened to.  Any test results from the previous visit will be discussed.  You may have a cervical check near your due date to see if your cervix has softened or thinned (effaced).  You will be tested for Group B streptococcus. This happens between 35 and 37 weeks. Your health care provider may ask you:  What your birth plan is.  How you are feeling.  If you are feeling the baby move.  If you have had any abnormal   symptoms, such as leaking fluid, bleeding, severe headaches, or abdominal cramping.  If you are using any tobacco products, including cigarettes, chewing tobacco, and electronic cigarettes.  If you have any questions. Other tests or screenings that may be performed during your third trimester include:  Blood tests that check for low iron levels (anemia).  Fetal testing to check the health, activity level, and growth of the fetus. Testing is done if you have certain medical conditions or if there are problems during the pregnancy.  Nonstress test  (NST). This test checks the health of your baby to make sure there are no signs of problems, such as the baby not getting enough oxygen. During this test, a belt is placed around your belly. The baby is made to move, and its heart rate is monitored during movement. What is false labor? False labor is a condition in which you feel small, irregular tightenings of the muscles in the womb (contractions) that usually go away with rest, changing position, or drinking water. These are called Braxton Hicks contractions. Contractions may last for hours, days, or even weeks before true labor sets in. If contractions come at regular intervals, become more frequent, increase in intensity, or become painful, you should see your health care provider. What are the signs of labor?  Abdominal cramps.  Regular contractions that start at 10 minutes apart and become stronger and more frequent with time.  Contractions that start on the top of the uterus and spread down to the lower abdomen and back.  Increased pelvic pressure and dull back pain.  A watery or bloody mucus discharge that comes from the vagina.  Leaking of amniotic fluid. This is also known as your "water breaking." It could be a slow trickle or a gush. Let your health care provider know if it has a color or strange odor. If you have any of these signs, call your health care provider right away, even if it is before your due date. Follow these instructions at home: Medicines  Follow your health care provider's instructions regarding medicine use. Specific medicines may be either safe or unsafe to take during pregnancy.  Take a prenatal vitamin that contains at least 600 micrograms (mcg) of folic acid.  If you develop constipation, try taking a stool softener if your health care provider approves. Eating and drinking   Eat a balanced diet that includes fresh fruits and vegetables, whole grains, good sources of protein such as meat, eggs, or tofu,  and low-fat dairy. Your health care provider will help you determine the amount of weight gain that is right for you.  Avoid raw meat and uncooked cheese. These carry germs that can cause birth defects in the baby.  If you have low calcium intake from food, talk to your health care provider about whether you should take a daily calcium supplement.  Eat four or five small meals rather than three large meals a day.  Limit foods that are high in fat and processed sugars, such as fried and sweet foods.  To prevent constipation: ? Drink enough fluid to keep your urine clear or pale yellow. ? Eat foods that are high in fiber, such as fresh fruits and vegetables, whole grains, and beans. Activity  Exercise only as directed by your health care provider. Most women can continue their usual exercise routine during pregnancy. Try to exercise for 30 minutes at least 5 days a week. Stop exercising if you experience uterine contractions.  Avoid heavy lifting.  Do   not exercise in extreme heat or humidity, or at high altitudes.  Wear low-heel, comfortable shoes.  Practice good posture.  You may continue to have sex unless your health care provider tells you otherwise. Relieving pain and discomfort  Take frequent breaks and rest with your legs elevated if you have leg cramps or low back pain.  Take warm sitz baths to soothe any pain or discomfort caused by hemorrhoids. Use hemorrhoid cream if your health care provider approves.  Wear a good support bra to prevent discomfort from breast tenderness.  If you develop varicose veins: ? Wear support pantyhose or compression stockings as told by your healthcare provider. ? Elevate your feet for 15 minutes, 3-4 times a day. Prenatal care  Write down your questions. Take them to your prenatal visits.  Keep all your prenatal visits as told by your health care provider. This is important. Safety  Wear your seat belt at all times when driving.  Make  a list of emergency phone numbers, including numbers for family, friends, the hospital, and police and fire departments. General instructions  Avoid cat litter boxes and soil used by cats. These carry germs that can cause birth defects in the baby. If you have a cat, ask someone to clean the litter box for you.  Do not travel far distances unless it is absolutely necessary and only with the approval of your health care provider.  Do not use hot tubs, steam rooms, or saunas.  Do not drink alcohol.  Do not use any products that contain nicotine or tobacco, such as cigarettes and e-cigarettes. If you need help quitting, ask your health care provider.  Do not use any medicinal herbs or unprescribed drugs. These chemicals affect the formation and growth of the baby.  Do not douche or use tampons or scented sanitary pads.  Do not cross your legs for long periods of time.  To prepare for the arrival of your baby: ? Take prenatal classes to understand, practice, and ask questions about labor and delivery. ? Make a trial run to the hospital. ? Visit the hospital and tour the maternity area. ? Arrange for maternity or paternity leave through employers. ? Arrange for family and friends to take care of pets while you are in the hospital. ? Purchase a rear-facing car seat and make sure you know how to install it in your car. ? Pack your hospital bag. ? Prepare the baby's nursery. Make sure to remove all pillows and stuffed animals from the baby's crib to prevent suffocation.  Visit your dentist if you have not gone during your pregnancy. Use a soft toothbrush to brush your teeth and be gentle when you floss. Contact a health care provider if:  You are unsure if you are in labor or if your water has broken.  You become dizzy.  You have mild pelvic cramps, pelvic pressure, or nagging pain in your abdominal area.  You have lower back pain.  You have persistent nausea, vomiting, or  diarrhea.  You have an unusual or bad smelling vaginal discharge.  You have pain when you urinate. Get help right away if:  Your water breaks before 37 weeks.  You have regular contractions less than 5 minutes apart before 37 weeks.  You have a fever.  You are leaking fluid from your vagina.  You have spotting or bleeding from your vagina.  You have severe abdominal pain or cramping.  You have rapid weight loss or weight gain.  You have   shortness of breath with chest pain.  You notice sudden or extreme swelling of your face, hands, ankles, feet, or legs.  Your baby makes fewer than 10 movements in 2 hours.  You have severe headaches that do not go away when you take medicine.  You have vision changes. Summary  The third trimester is from week 28 through week 40, months 7 through 9. The third trimester is a time when the unborn baby (fetus) is growing rapidly.  During the third trimester, your discomfort may increase as you and your baby continue to gain weight. You may have abdominal, leg, and back pain, sleeping problems, and an increased need to urinate.  During the third trimester your breasts will keep growing and they will continue to become tender. A yellow fluid (colostrum) may leak from your breasts. This is the first milk you are producing for your baby.  False labor is a condition in which you feel small, irregular tightenings of the muscles in the womb (contractions) that eventually go away. These are called Braxton Hicks contractions. Contractions may last for hours, days, or even weeks before true labor sets in.  Signs of labor can include: abdominal cramps; regular contractions that start at 10 minutes apart and become stronger and more frequent with time; watery or bloody mucus discharge that comes from the vagina; increased pelvic pressure and dull back pain; and leaking of amniotic fluid. This information is not intended to replace advice given to you by your  health care provider. Make sure you discuss any questions you have with your health care provider. Document Revised: 04/22/2018 Document Reviewed: 02/05/2016 Elsevier Patient Education  2020 [employer]  

## 2019-08-12 ENCOUNTER — Encounter: Payer: Self-pay | Admitting: *Deleted

## 2019-08-12 ENCOUNTER — Ambulatory Visit (INDEPENDENT_AMBULATORY_CARE_PROVIDER_SITE_OTHER): Payer: Medicaid Other | Admitting: *Deleted

## 2019-08-12 ENCOUNTER — Other Ambulatory Visit: Payer: Self-pay

## 2019-08-12 VITALS — BP 115/75 | HR 91 | Ht 67.0 in | Wt 241.9 lb

## 2019-08-12 DIAGNOSIS — Z0130 Encounter for examination of blood pressure without abnormal findings: Secondary | ICD-10-CM

## 2019-08-12 NOTE — Progress Notes (Signed)
Pt here for BP check and desiring for her BP cuff to be assessed for accuracy. BP w/office machine - 115/75, P- 91; and w/pt cuff BP was initially 138/85. It was observed that pt was not placing the cuff properly on her arm. After instruction of proper cuff placement, BP-118/88. She denies having H/A or visual disturbances and is aware to notify our office if these sx occur. Next office appt on 8/3 for ob f/u visit.  Pt voiced understanding of information and instructions given.

## 2019-08-15 ENCOUNTER — Telehealth: Payer: Self-pay | Admitting: Family Medicine

## 2019-08-15 NOTE — Telephone Encounter (Signed)
Patient calling have questions about receiving her Breast Pump

## 2019-08-15 NOTE — Progress Notes (Signed)
Patient was assessed and managed by nursing staff during this encounter. I have reviewed the chart and agree with the documentation and plan. I have also made any necessary editorial changes.  Falesha Schommer A Emma Birchler, MD 08/15/2019 8:59 AM   

## 2019-08-15 NOTE — Telephone Encounter (Signed)
Pt was notified earlier today that the prescription for breast pump has been signed and  faxed.

## 2019-08-16 ENCOUNTER — Other Ambulatory Visit: Payer: Self-pay

## 2019-08-16 ENCOUNTER — Other Ambulatory Visit (HOSPITAL_COMMUNITY)
Admission: RE | Admit: 2019-08-16 | Discharge: 2019-08-16 | Disposition: A | Discharge status: Home / Self Care | Payer: Medicaid Other | Source: Ambulatory Visit | Attending: Student | Admitting: Student

## 2019-08-16 ENCOUNTER — Ambulatory Visit (INDEPENDENT_AMBULATORY_CARE_PROVIDER_SITE_OTHER): Payer: Medicaid Other | Admitting: Student

## 2019-08-16 VITALS — BP 127/83 | HR 108 | Wt 243.5 lb

## 2019-08-16 DIAGNOSIS — O0990 Supervision of high risk pregnancy, unspecified, unspecified trimester: Secondary | ICD-10-CM | POA: Diagnosis not present

## 2019-08-16 NOTE — Progress Notes (Signed)
   PRENATAL VISIT NOTE  Subjective:  Sherri Clark is a 29 y.o. G2P0101 at [redacted]w[redacted]d being seen today for ongoing prenatal care.  She is currently monitored for the following issues for this high-risk pregnancy and has Supervision of high risk pregnancy, antepartum; History of gonorrhea; History of cesarean delivery; BMI 29.0-29.9,adult; and History of gestational hypertension on their problem list.  Patient reports no complaints. She denies headaches, reports that she is now checking her BP correctly at home and it is normal.  Contractions: Irritability. Vag. Bleeding: None.  Movement: Present. Denies leaking of fluid.   Patient reports that all of her blood pressures at home have been incorrect since she started checking it. She reports that she was taught how to use her BP cuff correctly on 08/12/2019.   The following portions of the patient's history were reviewed and updated as appropriate: allergies, current medications, past family history, past medical history, past social history, past surgical history and problem list.   Objective:   Vitals:   08/16/19 1503  BP: 127/83  Pulse: (!) 108  Weight: 243 lb 8 oz (110.5 kg)    Fetal Status: Fetal Heart Rate (bpm): 144   Movement: Present     General:  Alert, oriented and cooperative. Patient is in no acute distress.  Skin: Skin is warm and dry. No rash noted.   Cardiovascular: Normal heart rate noted  Respiratory: Normal respiratory effort, no problems with respiration noted  Abdomen: Soft, gravid, appropriate for gestational age.  Pain/Pressure: Present     Pelvic: Cervical exam performed in the presence of a chaperone        Extremities: Normal range of motion.  Edema: Mild pitting, slight indentation  Mental Status: Normal mood and affect. Normal behavior. Normal judgment and thought content.   Assessment and Plan:  Pregnancy: G2P0101 at [redacted]w[redacted]d 1. Supervision of high risk pregnancy, antepartum Fundal height measuring large,  will send for [place]  -BP has been normal in clinic; will have patient been seen weekly in order to check BP and developing GHTN or pre-e. Will disregard babyRX BPs; BPs in clinic have all been normal.  -Vertex by [place] - Culture, beta strep (group b only) - GC/Chlamydia probe amp ([facility])not at [facility] - [place] MFM OB FOLLOW UP; Future  Preterm labor symptoms and general obstetric precautions including but not limited to vaginal bleeding, contractions, leaking of fluid and fetal movement were reviewed in detail with the patient. Please refer to After Visit Summary for other counseling recommendations.   No follow-ups on file.  No future appointments.  Marylene Land, CNM

## 2019-08-18 ENCOUNTER — Other Ambulatory Visit: Payer: Self-pay

## 2019-08-18 ENCOUNTER — Ambulatory Visit: Payer: Medicaid Other | Attending: Obstetrics and Gynecology

## 2019-08-18 ENCOUNTER — Ambulatory Visit: Payer: Medicaid Other | Admitting: *Deleted

## 2019-08-18 DIAGNOSIS — Z3A36 36 weeks gestation of pregnancy: Secondary | ICD-10-CM

## 2019-08-18 DIAGNOSIS — O26843 Uterine size-date discrepancy, third trimester: Secondary | ICD-10-CM

## 2019-08-18 DIAGNOSIS — O0990 Supervision of high risk pregnancy, unspecified, unspecified trimester: Secondary | ICD-10-CM | POA: Diagnosis not present

## 2019-08-18 DIAGNOSIS — O09213 Supervision of pregnancy with history of pre-term labor, third trimester: Secondary | ICD-10-CM

## 2019-08-18 DIAGNOSIS — Z8759 Personal history of other complications of pregnancy, childbirth and the puerperium: Secondary | ICD-10-CM

## 2019-08-18 DIAGNOSIS — Z8619 Personal history of other infectious and parasitic diseases: Secondary | ICD-10-CM

## 2019-08-18 DIAGNOSIS — O34219 Maternal care for unspecified type scar from previous cesarean delivery: Secondary | ICD-10-CM | POA: Diagnosis not present

## 2019-08-18 DIAGNOSIS — O09293 Supervision of pregnancy with other poor reproductive or obstetric history, third trimester: Secondary | ICD-10-CM

## 2019-08-18 LAB — GC/CHLAMYDIA PROBE AMP (~~LOC~~) NOT AT ARMC
Chlamydia: NEGATIVE
Comment: NEGATIVE
Comment: NORMAL
Neisseria Gonorrhea: NEGATIVE

## 2019-08-19 ENCOUNTER — Other Ambulatory Visit: Payer: Self-pay

## 2019-08-19 ENCOUNTER — Inpatient Hospital Stay (HOSPITAL_BASED_OUTPATIENT_CLINIC_OR_DEPARTMENT_OTHER): Payer: Medicaid Other

## 2019-08-19 ENCOUNTER — Inpatient Hospital Stay (HOSPITAL_COMMUNITY): Payer: Medicaid Other

## 2019-08-19 ENCOUNTER — Inpatient Hospital Stay (HOSPITAL_COMMUNITY)
Admission: AD | Admit: 2019-08-19 | Discharge: 2019-08-19 | Disposition: A | Discharge status: Home / Self Care | Payer: Medicaid Other | Attending: Obstetrics and Gynecology | Admitting: Obstetrics and Gynecology

## 2019-08-19 ENCOUNTER — Encounter (HOSPITAL_COMMUNITY): Payer: Self-pay | Admitting: Obstetrics and Gynecology

## 2019-08-19 DIAGNOSIS — O368390 Maternal care for abnormalities of the fetal heart rate or rhythm, unspecified trimester, not applicable or unspecified: Secondary | ICD-10-CM

## 2019-08-19 DIAGNOSIS — Z3A36 36 weeks gestation of pregnancy: Secondary | ICD-10-CM

## 2019-08-19 DIAGNOSIS — O09293 Supervision of pregnancy with other poor reproductive or obstetric history, third trimester: Secondary | ICD-10-CM

## 2019-08-19 DIAGNOSIS — O09213 Supervision of pregnancy with history of pre-term labor, third trimester: Secondary | ICD-10-CM | POA: Diagnosis not present

## 2019-08-19 DIAGNOSIS — Z9049 Acquired absence of other specified parts of digestive tract: Secondary | ICD-10-CM | POA: Diagnosis not present

## 2019-08-19 DIAGNOSIS — R109 Unspecified abdominal pain: Secondary | ICD-10-CM

## 2019-08-19 DIAGNOSIS — Z3689 Encounter for other specified antenatal screening: Secondary | ICD-10-CM

## 2019-08-19 DIAGNOSIS — R748 Abnormal levels of other serum enzymes: Secondary | ICD-10-CM

## 2019-08-19 DIAGNOSIS — O99891 Other specified diseases and conditions complicating pregnancy: Secondary | ICD-10-CM | POA: Diagnosis not present

## 2019-08-19 DIAGNOSIS — O212 Late vomiting of pregnancy: Secondary | ICD-10-CM | POA: Insufficient documentation

## 2019-08-19 DIAGNOSIS — O34219 Maternal care for unspecified type scar from previous cesarean delivery: Secondary | ICD-10-CM | POA: Diagnosis not present

## 2019-08-19 DIAGNOSIS — O368330 Maternal care for abnormalities of the fetal heart rate or rhythm, third trimester, not applicable or unspecified: Secondary | ICD-10-CM | POA: Insufficient documentation

## 2019-08-19 DIAGNOSIS — O26899 Other specified pregnancy related conditions, unspecified trimester: Secondary | ICD-10-CM

## 2019-08-19 DIAGNOSIS — Z87891 Personal history of nicotine dependence: Secondary | ICD-10-CM | POA: Insufficient documentation

## 2019-08-19 DIAGNOSIS — R112 Nausea with vomiting, unspecified: Secondary | ICD-10-CM

## 2019-08-19 DIAGNOSIS — O26893 Other specified pregnancy related conditions, third trimester: Secondary | ICD-10-CM | POA: Diagnosis not present

## 2019-08-19 LAB — URINALYSIS, ROUTINE W REFLEX MICROSCOPIC
Bilirubin Urine: NEGATIVE
Glucose, UA: NEGATIVE mg/dL
Hgb urine dipstick: NEGATIVE
Ketones, ur: NEGATIVE mg/dL
Leukocytes,Ua: NEGATIVE
Nitrite: NEGATIVE
Protein, ur: NEGATIVE mg/dL
Specific Gravity, Urine: 1.012 (ref 1.005–1.030)
pH: 7 (ref 5.0–8.0)

## 2019-08-19 LAB — CBC WITH DIFFERENTIAL/PLATELET
Abs Immature Granulocytes: 0.03 10*3/uL (ref 0.00–0.07)
Basophils Absolute: 0 10*3/uL (ref 0.0–0.1)
Basophils Relative: 0 %
Eosinophils Absolute: 0 10*3/uL (ref 0.0–0.5)
Eosinophils Relative: 0 %
HCT: 38.9 % (ref 36.0–46.0)
Hemoglobin: 12.8 g/dL (ref 12.0–15.0)
Immature Granulocytes: 0 %
Lymphocytes Relative: 22 %
Lymphs Abs: 1.7 10*3/uL (ref 0.7–4.0)
MCH: 30.8 pg (ref 26.0–34.0)
MCHC: 32.9 g/dL (ref 30.0–36.0)
MCV: 93.7 fL (ref 80.0–100.0)
Monocytes Absolute: 0.5 10*3/uL (ref 0.1–1.0)
Monocytes Relative: 7 %
Neutro Abs: 5.5 10*3/uL (ref 1.7–7.7)
Neutrophils Relative %: 71 %
Platelets: 238 10*3/uL (ref 150–400)
RBC: 4.15 MIL/uL (ref 3.87–5.11)
RDW: 13.2 % (ref 11.5–15.5)
WBC: 7.8 10*3/uL (ref 4.0–10.5)
nRBC: 0 % (ref 0.0–0.2)

## 2019-08-19 LAB — COMPREHENSIVE METABOLIC PANEL
ALT: 20 U/L (ref 0–44)
AST: 20 U/L (ref 15–41)
Albumin: 2.7 g/dL — ABNORMAL LOW (ref 3.5–5.0)
Alkaline Phosphatase: 121 U/L (ref 38–126)
Anion gap: 9 (ref 5–15)
BUN: <5 mg/dL — ABNORMAL LOW (ref 6–20)
CO2: 21 mmol/L — ABNORMAL LOW (ref 22–32)
Calcium: 8.9 mg/dL (ref 8.9–10.3)
Chloride: 108 mmol/L (ref 98–111)
Creatinine, Ser: 0.56 mg/dL (ref 0.44–1.00)
GFR calc Af Amer: >60 mL/min (ref >60)
GFR calc non Af Amer: >60 mL/min (ref >60)
Glucose, Bld: 75 mg/dL (ref 70–99)
Potassium: 3.8 mmol/L (ref 3.5–5.1)
Sodium: 138 mmol/L (ref 135–145)
Total Bilirubin: 0.8 mg/dL (ref 0.3–1.2)
Total Protein: 6.2 g/dL — ABNORMAL LOW (ref 6.5–8.1)

## 2019-08-19 LAB — CULTURE, BETA STREP (GROUP B ONLY): Strep Gp B Culture: POSITIVE — AB

## 2019-08-19 LAB — LIPASE, BLOOD: Lipase: 17 U/L (ref 11–51)

## 2019-08-19 LAB — AMYLASE: Amylase: 122 U/L — ABNORMAL HIGH (ref 28–100)

## 2019-08-19 MED ORDER — MORPHINE SULFATE (PF) 10 MG/ML IV SOLN
10.0000 mg | Freq: Once | INTRAVENOUS | Status: AC
  Administered 2019-08-19: 10 mg via INTRAMUSCULAR
  Filled 2019-08-19: qty 1

## 2019-08-19 MED ORDER — LACTATED RINGERS IV SOLN: INTRAVENOUS | Status: DC

## 2019-08-19 MED ORDER — PROMETHAZINE HCL 25 MG/ML IJ SOLN
12.5000 mg | Freq: Once | INTRAMUSCULAR | Status: AC
  Administered 2019-08-19: 12.5 mg via INTRAVENOUS
  Filled 2019-08-19: qty 1

## 2019-08-19 MED ORDER — OXYCODONE-ACETAMINOPHEN 5-325 MG PO TABS: 1.0000 | ORAL_TABLET | Freq: Four times a day (QID) | ORAL | 0 refills | Status: DC | PRN

## 2019-08-19 MED ORDER — FENTANYL CITRATE (PF) 100 MCG/2ML IJ SOLN
50.0000 ug | Freq: Once | INTRAMUSCULAR | Status: AC
  Administered 2019-08-19: 50 ug via INTRAVENOUS
  Filled 2019-08-19: qty 2

## 2019-08-19 NOTE — MAU Note (Signed)
Pt states pain came on suddenly, is in her upper abdomen and is constant. Rates pain 10/10. Denies cramping, bleeding. +FM.

## 2019-08-19 NOTE — MAU Provider Note (Signed)
History     CSN: [id number]  Arrival date and time: 08/19/19 4081   First Provider Initiated Contact with Patient 08/19/19 1825      No chief complaint on file.  [name] is a 29 y.o. G2P0101 at 40w5dwho presents to MAU for abdominal pain.  Onset: 0400 this morning Location: fundus/right side of abdomen Duration: <24hours Character: sharp, constant, pain does not ebb/flow, feels tight and sore Aggravating/Associated: none/nausea and vomiting x3 Relieving: none Treatment: Tylenol '1000mg'$  2PM - did not help Severity: 10/10  Pt denies VB, LOF, ctx, decreased FM, vaginal discharge/odor/itching. Pt denies constipation, diarrhea, or urinary problems. Pt denies fever, chills, fatigue, sweating or changes in appetite. Pt denies SOB or chest pain. Pt denies dizziness, HA, light-headedness, weakness.  Problems this pregnancy include: previous C/S x1, hx placental abruption, hx gHTN. Allergies? NKDA Current medications/supplements? Zyrtec, PNVs Prenatal care provider? [facility], 08/31/2019   OB History    Gravida  2   Para  1   Term      Preterm  1   AB      Living  1     SAB      TAB      Ectopic      Multiple      Live Births  1           Past Medical History:  Diagnosis Date  . Gonorrhea 2013  . Pregnancy induced hypertension     Past Surgical History:  Procedure Laterality Date  . CESAREAN SECTION    . CHOLECYSTECTOMY    . EYE SURGERY      Family History  Problem Relation Age of Onset  . Lupus Mother   . Hypertension Father     Social History   Tobacco Use  . Smoking status: Former Smoker    Types: Cigarettes    Quit date: 01/30/2017    Years since quitting: 2.5  . Smokeless tobacco: Never Used  Vaping Use  . Vaping Use: Never used  Substance Use Topics  . Alcohol use: Never  . Drug use: Never    Allergies: No Known Allergies  Medications Prior to Admission  Medication Sig Dispense Refill Last Dose  . acetaminophen  (TYLENOL) 500 MG tablet Take 500 mg by mouth every 6 (six) hours as needed for mild pain or moderate pain.    08/19/2019 at Unknown time  . Prenatal Vit-Fe Fumarate-FA (PREPLUS) 27-1 MG TABS Take 1 tablet by mouth daily. 30 tablet 6 08/19/2019 at Unknown time  . Blood Pressure Monitoring (BLOOD PRESSURE KIT) DEVI 1 Device by Does not apply route as needed. 1 each 0   . cetirizine (ZYRTEC) 10 MG tablet Take 10 mg by mouth daily.    at unknown    Review of Systems  Constitutional: Negative for chills, diaphoresis, fatigue and fever.  Eyes: Negative for visual disturbance.  Respiratory: Negative for shortness of breath.   Cardiovascular: Negative for chest pain.  Gastrointestinal: Positive for abdominal pain, nausea and vomiting. Negative for constipation and diarrhea.  Genitourinary: Negative for dysuria, flank pain, frequency, pelvic pain, urgency, vaginal bleeding and vaginal discharge.  Neurological: Negative for dizziness, weakness, light-headedness and headaches.   Physical Exam   Blood pressure 113/73, pulse 97, temperature 98.5 F (36.9 C), temperature source Oral, resp. rate 20, last menstrual period 11/28/2018, SpO2 98 %, unknown if currently breastfeeding.  Patient Vitals for the past 24 hrs:  BP Temp Temp src Pulse Resp SpO2  08/19/19 1803 113/73  98.5 F (36.9 C) Oral 97 20 98 %   Physical Exam Vitals and nursing note reviewed.  Constitutional:      General: She is in acute distress.     Appearance: Normal appearance. She is not ill-appearing, toxic-appearing or diaphoretic.  HENT:     Head: Normocephalic and atraumatic.  Pulmonary:     Effort: Pulmonary effort is normal.  Abdominal:     Palpations: Abdomen is soft.     Tenderness: There is abdominal tenderness (generalized on mild palpation). There is guarding.     Comments: (edematous along lower uterine segment)  Skin:    General: Skin is warm and dry.  Neurological:     Mental Status: She is alert and oriented to  person, place, and time.  Psychiatric:        Mood and Affect: Mood normal.        Behavior: Behavior normal.        Thought Content: Thought content normal.        Judgment: Judgment normal.    Results for orders placed or performed during the hospital encounter of 08/19/19 (from the past 24 hour(s))  CBC with Differential/Platelet     Status: None   Collection Time: 08/19/19  6:39 PM  Result Value Ref Range   WBC 7.8 4.0 - 10.5 K/uL   RBC 4.15 3.87 - 5.11 MIL/uL   Hemoglobin 12.8 12.0 - 15.0 g/dL   HCT 38.9 36 - 46 %   MCV 93.7 80.0 - 100.0 fL   MCH 30.8 26.0 - 34.0 pg   MCHC 32.9 30.0 - 36.0 g/dL   RDW 13.2 11.5 - 15.5 %   Platelets 238 150 - 400 K/uL   nRBC 0.0 0.0 - 0.2 %   Neutrophils Relative % 71 %   Neutro Abs 5.5 1.7 - 7.7 K/uL   Lymphocytes Relative 22 %   Lymphs Abs 1.7 0.7 - 4.0 K/uL   Monocytes Relative 7 %   Monocytes Absolute 0.5 0 - 1 K/uL   Eosinophils Relative 0 %   Eosinophils Absolute 0.0 0 - 0 K/uL   Basophils Relative 0 %   Basophils Absolute 0.0 0 - 0 K/uL   Immature Granulocytes 0 %   Abs Immature Granulocytes 0.03 0.00 - 0.07 K/uL  Comprehensive metabolic panel     Status: Abnormal   Collection Time: 08/19/19  6:39 PM  Result Value Ref Range   Sodium 138 135 - 145 mmol/L   Potassium 3.8 3.5 - 5.1 mmol/L   Chloride 108 98 - 111 mmol/L   CO2 21 (L) 22 - 32 mmol/L   Glucose, Bld 75 70 - 99 mg/dL   BUN <5 (L) 6 - 20 mg/dL   Creatinine, Ser 0.56 0.44 - 1.00 mg/dL   Calcium 8.9 8.9 - 10.3 mg/dL   Total Protein 6.2 (L) 6.5 - 8.1 g/dL   Albumin 2.7 (L) 3.5 - 5.0 g/dL   AST 20 15 - 41 U/L   ALT 20 0 - 44 U/L   Alkaline Phosphatase 121 38 - 126 U/L   Total Bilirubin 0.8 0.3 - 1.2 mg/dL   GFR calc non Af Amer >60 >60 mL/min   GFR calc Af Amer >60 >60 mL/min   Anion gap 9 5 - 15  Amylase     Status: Abnormal   Collection Time: 08/19/19  6:39 PM  Result Value Ref Range   Amylase 122 (H) 28 - 100 U/L  Lipase, blood  Status: None   Collection  Time: 08/19/19  6:39 PM  Result Value Ref Range   Lipase 17 11 - 51 U/L  Urinalysis, Routine w reflex microscopic Urine, Clean Catch     Status: None   Collection Time: 08/19/19  8:11 PM  Result Value Ref Range   Color, Urine YELLOW YELLOW   APPearance CLEAR CLEAR   Specific Gravity, Urine 1.012 1.005 - 1.030   pH 7.0 5.0 - 8.0   Glucose, UA NEGATIVE NEGATIVE mg/dL   Hgb urine dipstick NEGATIVE NEGATIVE   Bilirubin Urine NEGATIVE NEGATIVE   Ketones, ur NEGATIVE NEGATIVE mg/dL   Protein, ur NEGATIVE NEGATIVE mg/dL   Nitrite NEGATIVE NEGATIVE   Leukocytes,Ua NEGATIVE NEGATIVE    US Abdomen Complete  Result Date: 08/19/2019 CLINICAL DATA:  Nausea and vomiting. Thirty-six weeks 5 days pregnant. Abdominal pain. Previous cholecystectomy 2014 EXAM: ABDOMEN ULTRASOUND COMPLETE COMPARISON:  None. FINDINGS: Gallbladder: The gallbladder is surgically absent. Common bile duct: Diameter: 6 mm, normal Liver: No focal lesion identified. Within normal limits in parenchymal echogenicity. Portal vein is patent on color Doppler imaging with normal direction of blood flow towards the liver. IVC: No abnormality visualized. Pancreas: Visualized portion unremarkable. Spleen: Size and appearance within normal limits. Right Kidney: Length: 10.3 cm. Echogenicity within normal limits. No mass or hydronephrosis visualized. Left Kidney: Length: 12.4 cm. Echogenicity within normal limits. No mass or hydronephrosis visualized. Abdominal aorta: Limited visualization. Visualized portions are normal in caliber. Other findings: None. IMPRESSION: Surgical absence of the gallbladder. No biliary ductal dilatation. Liver is unremarkable. No hydronephrosis. Electronically Signed   By: Lucienne Capers M.D.   On: 08/19/2019 20:16   [place] MFM OB FOLLOW UP  Result Date: 08/18/2019 ----------------------------------------------------------------------  OBSTETRICS REPORT                       (Signed Final 08/18/2019 02:15 pm)  ---------------------------------------------------------------------- Patient Info  ID #:       [id number]                          D.O.B.:  1990/11/28 (29 yrs)  Name:       Sherri Clark             Visit Date: 08/18/2019 01:14 pm ---------------------------------------------------------------------- Performed By  Attending:        Johnell Comings MD         Ref. Address:      [address]                                                              [place], [place]  Performed By:     Hubert Azure          Location[facility]          [facility]                                                              [facility]                                                              [facility]  Referred By:      [facility] ---------------------------------------------------------------------- Orders  #  Description                           Code        Ordered By  1  [place] MFM OB FOLLOW UP                   [phone]    Maye Hides ----------------------------------------------------------------------  #  Order #                     Accession #                Episode #  1  [id number]                   [id number]                 [id number] ---------------------------------------------------------------------- Indications  Uterine size-date discrepancy, third trimester  O26.843  [redacted] weeks gestation of pregnancy                 Z3A.36  Poor obstetric history: Previous preterm        O09.219  delivery, antepartum (30 wks, c-section,  fetal distress)  Previous cesarean delivery, antepartum          O34.219  Poor obstetric history: Previous                O09.299  preeclampsia / eclampsia/gestational HTN ---------------------------------------------------------------------- Fetal Evaluation  Num Of Fetuses:          1  Fetal Heart              126  Rate(bpm):   Cardiac Activity:        Observed  Presentation:            Cephalic  Amniotic Fluid  AFI FV:      Within normal limits  AFI Sum(cm)     %Tile       Largest Pocket(cm)  21.17           81          7.14  RUQ(cm)       RLQ(cm)       LUQ(cm)        LLQ(cm)  7.14  4.25          4.87           4.91 ---------------------------------------------------------------------- Biometry  BPD:      94.7  mm     G. Age:  38w 4d         96  %    CI:         77.79  %    70 - 86                                                          FL/HC:       20.9  %    20.8 - 22.6  HC:      339.8  mm     G. Age:  39w 1d         81  %    HC/AC:       0.91       0.92 - 1.05  AC:      375.1  mm     G. Age:  41w 3d       > 99  %    FL/BPD:      75.1  %    71 - 87  FL:       71.1  mm     G. Age:  36w 3d         44  %    FL/AC:       19.0  %    20 - 24  Est. FW:    3907   g    8 lb 10 oz    > 99  %                     m ---------------------------------------------------------------------- OB History  Blood Type:   O+  Gravidity:    2         Term:   0        Prem:   1         SAB:   0  TOP:          0       Ectopic:  0        Living: 1 ---------------------------------------------------------------------- Gestational Age  LMP:           37w 3d        Date:  11/29/18                 EDD:    09/05/19  U/S Today:     38w 6d                                        EDD:    08/26/19  Best:          36w 4d     Det. ByLoman Chroman         EDD:    09/11/19                                      (01/20/19) ---------------------------------------------------------------------- Anatomy  Cranium:  Appears normal         Aortic Arch:            Previously seen  Cavum:                 Appears normal         Ductal Arch:            Previously seen  Ventricles:            Previously seen        Diaphragm:              Appears normal  Choroid Plexus:        Previously seen        Stomach:                Appears normal,                                                                         left sided  Cerebellum:            Previously seen        Abdomen:                Appears normal  Posterior Fossa:       Previously seen        Abdominal Wall:         Previously seen  Nuchal Fold:           Previously seen        Cord Vessels:           Previously seen  Face:                  Orbits and profile     Kidneys:                Previously seen                         previously seen  Lips:                  Appears normal         Bladder:                Appears normal  Thoracic:              Previously seen        Spine:                  Previously seen  Heart:                 Previously seen        Upper Extremities:      Previously seen  RVOT:                  Previously seen        Lower Extremities:      Previously seen  LVOT:                  Previously seen  Other:  Female gender. Technically difficult due to fetal position. ---------------------------------------------------------------------- Comments  This patient was seen for a follow up  growth scan as her  fundal heights have been measuring larger than her  gestational age.  The patient is planning a TOLAC.  The overall EFW measured today is greater than the 99th  percentile for her gestational age with an EFW of 8 pounds  10 ounces.  There was normal amniotic fluid noted.  Follow-up as indicated. ----------------------------------------------------------------------                   Johnell Comings, MD Electronically Signed Final Report   08/18/2019 02:15 pm ----------------------------------------------------------------------  [place] MFM OB FOLLOW UP  Result Date: 07/25/2019 ----------------------------------------------------------------------  OBSTETRICS REPORT                       (Signed Final 07/25/2019 08:30 am) ---------------------------------------------------------------------- Patient Info  ID #:       [id number]                          D.O.B.:  [birth year] (29 yrs)  Name:       Sherri Clark             Visit Date: 07/25/2019 07:36 am ---------------------------------------------------------------------- Performed By  Attending:        Tama High MD        Ref. Address:     [address]                                                             [place], [place]  Performed By:     Germain Osgood            Location[facility]  Referred By:      [facility] ---------------------------------------------------------------------- Orders  #  Description                           Code        Ordered By  1  [place] MFM OB FOLLOW UP                   40981.19    Earlie Server ----------------------------------------------------------------------  #  Order #                     Accession #  Episode #  1  [id number]                   [id number]                 [id number] ---------------------------------------------------------------------- Indications  [redacted] weeks gestation of pregnancy                Z3A.33  Uterine size-date discrepancy, third trimester O26.843  Poor obstetric history: Previous preterm       O09.219  delivery, antepartum (30 wks, c-section, fetal  distress)  Antenatal follow-up for nonvisualized fetal    Z36.2  anatomy  Previous cesarean delivery, antepartum         O34.219  Poor obstetric history: Previous               O09.299  preeclampsia / eclampsia/gestational HTN ---------------------------------------------------------------------- Fetal Evaluation  Num Of Fetuses:         1  Fetal Heart Rate(bpm):  128  Cardiac Activity:       Observed  Presentation:           Cephalic  Placenta:               Posterior  P. Cord Insertion:      Previously Visualized  Amniotic Fluid  AFI FV:      Within normal limits  AFI Sum(cm)     %Tile       Largest Pocket(cm)  15.6            56          5.3   RUQ(cm)       RLQ(cm)       LUQ(cm)        LLQ(cm)  5.3           3.5           4.3            2.5 ---------------------------------------------------------------------- Biometry  BPD:      84.4  mm     G. Age:  34w 0d         69  %    CI:        77.72   %    70 - 86                                                          FL/HC:      21.0   %    19.9 - 21.5  HC:       303   mm     G. Age:  33w 5d         26  %    HC/AC:      1.04        0.96 - 1.11  AC:      292.5  mm     G. Age:  33w 2d         55  %    FL/BPD:     75.4   %    71 - 87  FL:       63.6  mm     G. Age:  32w 6d         31  %    FL/AC:  21.7   %    20 - 24  LV:        1.2  mm  Est. FW:    2152  gm    4 lb 12 oz      44  % ---------------------------------------------------------------------- OB History  Blood Type:   O+  Gravidity:    2         Term:   0        Prem:   1        SAB:   0  TOP:          0       Ectopic:  0        Living: 1 ---------------------------------------------------------------------- Gestational Age  LMP:           34w 0d        Date:  11/29/18                 EDD:   09/05/19  U/S Today:     33w 3d                                        EDD:   09/09/19  Best:          33w 1d     Det. ByLoman Chroman         EDD:   09/11/19                                      (01/20/19) ---------------------------------------------------------------------- Anatomy  Cranium:               Previously seen        Aortic Arch:            Previously seen  Cavum:                 Previously seen        Ductal Arch:            Previously seen  Ventricles:            Appears normal         Diaphragm:              Appears normal  Choroid Plexus:        Previously seen        Stomach:                Appears normal, left                                                                        sided  Cerebellum:            Previously seen        Abdomen:                Appears normal  Posterior Fossa:       Previously seen        Abdominal Wall:          Previously seen  Nuchal Fold:           Previously seen        Cord Vessels:           Previously seen  Face:                  Orbits and profile     Kidneys:                Appear normal                         previously seen  Lips:                  Appears normal         Bladder:                Appears normal  Thoracic:              Previously seen        Spine:                  Previously seen  Heart:                 Previously seen        Upper Extremities:      Previously seen  RVOT:                  Previously seen        Lower Extremities:      Previously seen  LVOT:                  Previously seen  Other:  Female gender. Technically difficult due to fetal position. ---------------------------------------------------------------------- Cervix Uterus Adnexa  Cervix  Not visualized (advanced GA >24wks) ---------------------------------------------------------------------- Impression  Uterine-size-date discrepancy .  Fetal growth is appropriate for gestational age .Amniotic fluid  is normal and good fetal activity is seen .We reassured the  patient of the findings.  She does not have gestational diabetes.  Patient had questions about TOLAC. She showed her  previous op note (phone). A low-transverse uterine incision  was made.  She had signed a consent for TOLAC at her prenatal visit. I  reassured her that it was NOT a "classical" cesarean (patient  used the term). ---------------------------------------------------------------------- Recommendations  Follow-up scans as clinically indicated. ----------------------------------------------------------------------                  Tama High, MD Electronically Signed Final Report   07/25/2019 08:30 am ----------------------------------------------------------------------   MAU Course  Procedures  MDM -generalized abdominal pain with guarding and generalized uterine tenderness on mild palpation -patient in distress, 10/10 pain -N/V -hx of placental  abruption 2012 and C/S at 30w in previous pregnancy -GBS positive -GC/CT negative 3 days ago -pt denies urinary symptoms and denies tenderness over and just superior to pubic bone -UA: WNL, sending urine for culture based on symptoms -CBC w/ Diff: WNL -CMP: no abnormalities requiring treatment -Amylase: H (122) -Lipase: WNL -Limited OB US: VTX, AFI 18.2cm -Abdomen Complete: gallbladder surgically absent, no abnormality -Fentanyl 27mg and phenergan 12.'5mg'$  given, pt reports pain now 8/10 but patient is no longer in apparent distress and N/V now not present Dilation: Closed Exam by:: L Cox Rn  -EFM: reactive       -baseline: 130       -variability: moderate       -accels: present, 15x15       -  decels: absent       -TOCO: few ctx, pt not feeling -consulted with Dr. Rip Harbour, pt OK to be discharged home, can give '10mg'$  morphine IM prior to discharge and send home with #5-10 Percocet for home use. Appt should also be rescheduled for one week from last visit rather than 2. -after speaking with Dr. Rip Harbour, pt had approx 5 min decel, difficult to associate with ctx as nurse reports she was in the room and patient was moving at the time. Patient placed on her side and baby recovered well. Dr. Rip Harbour notified. Since strip overall reactive, will watch from now until 26mn after morphine injection. Per Dr. ERip Harbour if strip continues to be normal, pt can be discharged to home. -NST reactive after morphine administration -pt discharged to home in stable condition  Orders Placed This Encounter  Procedures  . Culture, OB Urine    Standing Status:   Standing    Number of Occurrences:   1  . [place] OB LIMITED    Standing Status:   Standing    Number of Occurrences:   1    Order Specific Question:   Symptom/Reason for Exam    Answer:   Abdominal pain in pregnancy [[[id number]] . [place]domen Complete    Standing Status:   Standing    Number of Occurrences:   1    Order Specific Question:   Symptom/Reason for  Exam    Answer:   Abdominal pain in pregnancy [[[id number]] . CBC with Differential/Platelet    Standing Status:   Standing    Number of Occurrences:   1  . Comprehensive metabolic panel    Standing Status:   Standing    Number of Occurrences:   1  . Amylase    Standing Status:   Standing    Number of Occurrences:   1  . Lipase, blood    Standing Status:   Standing    Number of Occurrences:   1  . Urinalysis, Routine w reflex microscopic    Standing Status:   Standing    Number of Occurrences:   1  . Insert peripheral IV    Standing Status:   Standing    Number of Occurrences:   1  . Discharge patient    Order Specific Question:   Discharge disposition    Answer:   01-Home or Self Care [1]    Order Specific Question:   Discharge patient date    Answer:   08/19/2019   Meds ordered this encounter  Medications  . fentaNYL (SUBLIMAZE) injection 50 mcg  . promethazine (PHENERGAN) injection 12.5 mg  . lactated ringers infusion  . morphine sulfate (PF) 10 MG/ML injection 10 mg  . oxyCODONE-acetaminophen (PERCOCET) 5-325 MG tablet    Sig: Take 1-2 tablets by mouth every 6 (six) hours as needed for up to 8 doses for severe pain.    Dispense:  8 tablet    Refill:  0    Order Specific Question:   Supervising Provider    Answer:   PDonnamae Jude[[[id number]]   Assessment and Plan   1. Abdominal pain in pregnancy   2. [redacted] weeks gestation of pregnancy   3. Fetal heart rate decelerations affecting management of mother   4. NST (non-stress test) reactive   5. Elevated amylase   6. Non-intractable vomiting with nausea, unspecified vomiting type     Allergies as of 08/19/2019   No Known Allergies  Medication List    TAKE these medications   acetaminophen 500 MG tablet Commonly known as: TYLENOL Take 500 mg by mouth every 6 (six) hours as needed for mild pain or moderate pain.   Blood Pressure Kit Devi 1 Device by Does not apply route as needed.   cetirizine 10 MG tablet Commonly  known as: ZYRTEC Take 10 mg by mouth daily.   oxyCODONE-acetaminophen 5-325 MG tablet Commonly known as: Percocet Take 1-2 tablets by mouth every 6 (six) hours as needed for up to 8 doses for severe pain.   PrePLUS 27-1 MG Tabs Take 1 tablet by mouth daily.      -will call with culture results, if positive -message sent to [facility] with high importance to schedule pt for appointment next week -discussed possible etiologies of abdominal pain in pregnancy -discussed s/sx of labor -return MAU precautions given -pt discharged to home in stable condition  Elmyra Ricks E Maylin [place] 08/19/2019, 9:53 PM

## 2019-08-19 NOTE — Discharge Instructions (Signed)
Abdominal Pain During Pregnancy  Abdominal pain is common during pregnancy, and has many possible causes. Some causes are more serious than others, and sometimes the cause is not known. Abdominal pain can be a sign that labor is starting. It can also be caused by normal growth and stretching of muscles and ligaments during pregnancy. Always tell your health care provider if you have any abdominal pain. Follow these instructions at home:  Do not have sex or put anything in your vagina until your pain goes away completely.  Get plenty of rest until your pain improves.  Drink enough fluid to keep your urine pale yellow.  Take over-the-counter and prescription medicines only as told by your health care provider.  Keep all follow-up visits as told by your health care provider. This is important. Contact a health care provider if:  Your pain continues or gets worse after resting.  You have lower abdominal pain that: ? Comes and goes at regular intervals. ? Spreads to your back. ? Is similar to menstrual cramps.  You have pain or burning when you urinate. Get help right away if:  You have a fever or chills.  You have vaginal bleeding.  You are leaking fluid from your vagina.  You are passing tissue from your vagina.  You have vomiting or diarrhea that lasts for more than 24 hours.  Your baby is moving less than usual.  You feel very weak or faint.  You have shortness of breath.  You develop severe pain in your upper abdomen. Summary  Abdominal pain is common during pregnancy, and has many possible causes.  If you experience abdominal pain during pregnancy, tell your health care provider right away.  Follow your health care provider's home care instructions and keep all follow-up visits as directed. This information is not intended to replace advice given to you by your health care provider. Make sure you discuss any questions you have with your health care  provider. Document Revised: 04/19/2018 Document Reviewed: 04/03/2016 Elsevier Patient Education  2020 [employer] Inc[employer]        Preterm Labor and Birth Information  The normal length of a pregnancy is 39-41 weeks. Preterm labor is when labor starts before 37 completed weeks of pregnancy. What are the risk factors for preterm labor? Preterm labor is more likely to occur in women who:  Have certain infections during pregnancy such as a bladder infection, sexually transmitted infection, or infection inside the uterus (chorioamnionitis).  Have a shorter-than-normal cervix.  Have gone into preterm labor before.  Have had surgery on their cervix.  Are younger than age 17 or older than age 35.  Are African American.  Are pregnant with twins or multiple babies (multiple gestation).  Take street drugs or smoke while pregnant.  Do not gain enough weight while pregnant.  Became pregnant shortly after having been pregnant. What are the symptoms of preterm labor? Symptoms of preterm labor include:  Cramps similar to those that can happen during a menstrual period. The cramps may happen with diarrhea.  Pain in the abdomen or lower back.  Regular uterine contractions that may feel like tightening of the abdomen.  A feeling of increased pressure in the pelvis.  Increased watery or bloody mucus discharge from the vagina.  Water breaking (ruptured amniotic sac). Why is it important to recognize signs of preterm labor? It is important to recognize signs of preterm labor because babies who are born prematurely may not be fully developed. This can put them at   an increased risk for:  Long-term (chronic) heart and lung problems.  Difficulty immediately after birth with regulating body systems, including blood sugar, body temperature, heart rate, and breathing rate.  Bleeding in the brain.  Cerebral palsy.  Learning difficulties.  Death. These risks are highest for babies who are  born before 34 weeks of pregnancy. How is preterm labor treated? Treatment depends on the length of your pregnancy, your condition, and the health of your baby. It may involve:  Having a stitch (suture) placed in your cervix to prevent your cervix from opening too early (cerclage).  Taking or being given medicines, such as: ? Hormone medicines. These may be given early in pregnancy to help support the pregnancy. ? Medicine to stop contractions. ? Medicines to help mature the baby's lungs. These may be prescribed if the risk of delivery is high. ? Medicines to prevent your baby from developing cerebral palsy. If the labor happens before 34 weeks of pregnancy, you may need to stay in the hospital. What should I do if I think I am in preterm labor? If you think that you are going into preterm labor, call your health care provider right away. How can I prevent preterm labor in future pregnancies? To increase your chance of having a full-term pregnancy:  Do not use any tobacco products, such as cigarettes, chewing tobacco, and e-cigarettes. If you need help quitting, ask your health care provider.  Do not use street drugs or medicines that have not been prescribed to you during your pregnancy.  Talk with your health care provider before taking any herbal supplements, even if you have been taking them regularly.  Make sure you gain a healthy amount of weight during your pregnancy.  Watch for infection. If you think that you might have an infection, get it checked right away.  Make sure to tell your health care provider if you have gone into preterm labor before. This information is not intended to replace advice given to you by your health care provider. Make sure you discuss any questions you have with your health care provider. Document Revised: 04/23/2018 Document Reviewed: 05/23/2015 Elsevier Patient Education  2020 [employer] Inc.        Premature Rupture and Preterm Premature  Rupture of Membranes  Rupture of membranes is when the membranes (amniotic sac) that hold your baby break open. This is commonly referred to as your "water breaking." If your water breaks before labor starts (prematurely), it is called premature rupture of membranes (PROM). If PROM occurs before 37 weeks of pregnancy, it is called preterm premature rupture of membranes (PPROM). Because the amniotic sac keeps infection out and performs other important functions, having the amniotic sac rupture before 37 weeks of pregnancy can lead to serious problems. It requires immediate attention from a health care provider. What are the causes? When PROM occurs at 37 weeks of pregnancy or later, it is usually caused by natural weakening of the membranes and friction caused by contractions. PPROM is usually caused by infection. In many cases, the cause is not known. What increases the risk of PPROM? The following factors may make you more likely to have PPROM:  Infection.  Having had PPROM in a previous pregnancy.  Short cervical length.  Bleeding during the second or third trimester.  Low BMI, which is an estimate of body fat.  Smoking.  Using drugs.  Low socioeconomic status. What problems can be caused by PROM and PPROM? This condition creates health dangers for the  mother and the baby. These include:  Delivering a premature baby.  Getting a serious infection of the placental tissues (chorioamnionitis).  Early detachment of the placenta from the uterus (placental abruption).  Compression of the umbilical cord.  Developing a serious infection after delivery. What are the signs of PROM and PPROM? Signs of this condition include:  A sudden gush or slow leaking of fluid from the vagina.  Constant wet underwear. Sometimes, women mistake the leaking or wetness for urine, especially if the leak is slow and not a gush of fluid. If there is constant leaking or if your underwear continues to get  wet, your membranes have likely ruptured. What should I do if I think my membranes have ruptured?  Call your health care provider right away.  You will need to go to the hospital immediately to be checked by a health care provider. What happens if I am diagnosed with PROM or PPROM? Once you arrive at the hospital, you will have tests done. A cervical exam will be done using a lubricated instrument (speculum) to check whether the cervix has softened or started to open (dilate).  If you are diagnosed with PROM, your labor may be started for you (you may be induced) within 24 hours if you are not having contractions.  If you are diagnosed with PPROM and you are not having contractions, you may be induced depending on your trimester. If you have PPROM:  You and your baby will be monitored closely for signs of infection or other complications.  You may be given: ? An antibiotic medicine to lower the chances of developing an infection. ? A steroid medicine to help mature the baby's lungs more quickly. ? A medicine to help prevent cerebral palsy in your baby. ? A medicine to stop preterm labor.  You may be ordered to be on bed rest at home or in the hospital.  You may be induced if complications occur for you or the baby. Your treatment will depend on many factors, such as how many weeks you have been pregnant (how far along you are), the development of the baby, and other complications that may occur. This information is not intended to replace advice given to you by your health care provider. Make sure you discuss any questions you have with your health care provider. Document Revised: 04/23/2018 Document Reviewed: 08/06/2015 Elsevier Patient Education  2020 [employer].

## 2019-08-19 NOTE — MAU Note (Signed)
Started throwing up at 0400, has thrown up twice since then.  Started having pain in abd this abd, pain is constant. No bleeding or water leaking.  Took a nap, when she woke up, she was unable to bend her toes.(pain related)

## 2019-08-19 NOTE — MAU Note (Signed)
PT taken in wheelchair to front of hospital for pick up by boyfriend.

## 2019-08-20 ENCOUNTER — Encounter: Payer: Self-pay | Admitting: Student

## 2019-08-20 DIAGNOSIS — B951 Streptococcus, group B, as the cause of diseases classified elsewhere: Secondary | ICD-10-CM | POA: Insufficient documentation

## 2019-08-21 LAB — CULTURE, OB URINE: Culture: NO GROWTH

## 2019-08-23 ENCOUNTER — Other Ambulatory Visit: Payer: Self-pay

## 2019-08-23 ENCOUNTER — Ambulatory Visit (INDEPENDENT_AMBULATORY_CARE_PROVIDER_SITE_OTHER): Payer: Medicaid Other | Admitting: Obstetrics and Gynecology

## 2019-08-23 ENCOUNTER — Encounter: Payer: Self-pay | Admitting: Obstetrics and Gynecology

## 2019-08-23 VITALS — BP 120/81 | HR 101 | Wt 243.0 lb

## 2019-08-23 DIAGNOSIS — Z8619 Personal history of other infectious and parasitic diseases: Secondary | ICD-10-CM

## 2019-08-23 DIAGNOSIS — Z3A37 37 weeks gestation of pregnancy: Secondary | ICD-10-CM

## 2019-08-23 DIAGNOSIS — Z8759 Personal history of other complications of pregnancy, childbirth and the puerperium: Secondary | ICD-10-CM

## 2019-08-23 DIAGNOSIS — B951 Streptococcus, group B, as the cause of diseases classified elsewhere: Secondary | ICD-10-CM

## 2019-08-23 DIAGNOSIS — O0990 Supervision of high risk pregnancy, unspecified, unspecified trimester: Secondary | ICD-10-CM

## 2019-08-23 DIAGNOSIS — Z98891 History of uterine scar from previous surgery: Secondary | ICD-10-CM

## 2019-08-23 NOTE — Progress Notes (Signed)
VBAC consent obtained  Addison Naegeli, RN  08/23/19

## 2019-08-23 NOTE — Progress Notes (Signed)
   PRENATAL VISIT NOTE  Subjective:  Sherri Clark is a 29 y.o. G2P0101 at [redacted]w[redacted]d being seen today for ongoing prenatal care.  She is currently monitored for the following issues for this high-risk pregnancy and has Supervision of high risk pregnancy, antepartum; History of gonorrhea; History of cesarean delivery; BMI 29.0-29.9,adult; History of gestational hypertension; Positive GBS test; and [redacted] weeks gestation of pregnancy on their problem list.  Patient reports difficulty sleeping.  Contractions: Irritability. Vag. Bleeding: None.  Movement: Present. Denies leaking of fluid.   Of note, pt seen in MAU on 8/6 given concern of abdominal pain. Negative RUQ ultrasound. Pt reports complete resolution of abdominal pain without medications (s/p morphine in MAU and script for percocet on discharge).  Planning to breast and bottle feed. Desires hormonal postplacental IUD. No HA, vision changes, chest pain, SOB, RUQ pain. Normal Bps at home. TOLAC already signed. Daily PNV but no ASA (pt was not aware) of PreE ppx indication).  The following portions of the patient's history were reviewed and updated as appropriate: allergies, current medications, past family history, past medical history, past social history, past surgical history and problem list.   Objective:   Vitals:   08/23/19 1402  BP: 120/81  Pulse: (!) 101  Weight: 243 lb (110.2 kg)    Fetal Status: Fetal Heart Rate (bpm): 140   Movement: Present     General:  Alert, oriented and cooperative. Patient is in no acute distress.  Skin: Skin is warm and dry. No rash noted.   Cardiovascular: Normal heart rate noted  Respiratory: Normal respiratory effort, no problems with respiration noted  Abdomen: Soft, gravid, appropriate for gestational age.  Pain/Pressure: Present     Pelvic: Cervical exam deferred        Extremities: Normal range of motion.  Edema: None  Mental Status: Normal mood and affect. Normal behavior. Normal judgment and  thought content.   Assessment and Plan:  Pregnancy: G2P0101 at [redacted]w[redacted]d  1. Supervision of high risk pregnancy, [redacted] weeks GA: Doing well. No red flag symptoms. F/u US at [redacted]w[redacted]d in MAU showed appropriate fluid and normal fetal activity.  Planning to breast and bottle feed. Desires hormonal postplacental IUD.  - plan for f/u prenatal appt in 1 week; return precautions as noted below  2. History of gHTN in prior pregnancy: All blood pressures wnl this pregnancy and wnl today. No signs/symptoms of preeclampsia. - provided strict return precautions for blood pressures >140/90 and signs/symptoms of preeclampsia  3. History of Cesarean 2/2 Placental Abruption: TOLAC consent already signed. - labor and Preeclampsia precautions as noted above  4. History of gonorrhea: Positive on 02/28/19. S/p negative TOC on 04/02/19.  Term labor symptoms and general obstetric precautions including but not limited to vaginal bleeding, contractions, leaking of fluid and fetal movement were reviewed in detail with the patient. Please refer to After Visit Summary for other counseling recommendations.   Return in about 1 week (around 08/30/2019) for in person OB visit, any provider.  Future Appointments  Date Time Provider Department Center  08/31/2019  8:15 AM Reva Bores, MD [facility] [place]    Sheila Oats, MD  OB Fellow, [facility]

## 2019-08-23 NOTE — Patient Instructions (Addendum)
Your GBS test was positive, so we will plan for antibiotics in labor to prevent infections in your newborn.  Please return in 1 week for your next prenatal appointment or call sooner if concern of multiple blood pressures >140/90, severe headache, vision changes, chest pain, difficulty breathing, shortness of breath, or pain in your right upper abdomen.  Third Trimester of Pregnancy The third trimester is from week 28 through week 40 (months 7 through 9). The third trimester is a time when the unborn baby (fetus) is growing rapidly. At the end of the ninth month, the fetus is about 20 inches in length and weighs 6-10 pounds. Body changes during your third trimester Your body will continue to go through many changes during pregnancy. The changes vary from woman to woman. During the third trimester:  Your weight will continue to increase. You can expect to gain 25-35 pounds (11-16 kg) by the end of the pregnancy.  You may begin to get stretch marks on your hips, abdomen, and breasts.  You may urinate more often because the fetus is moving lower into your pelvis and pressing on your bladder.  You may develop or continue to have heartburn. This is caused by increased hormones that slow down muscles in the digestive tract.  You may develop or continue to have constipation because increased hormones slow digestion and cause the muscles that push waste through your intestines to relax.  You may develop hemorrhoids. These are swollen veins (varicose veins) in the rectum that can itch or be painful.  You may develop swollen, bulging veins (varicose veins) in your legs.  You may have increased body aches in the pelvis, back, or thighs. This is due to weight gain and increased hormones that are relaxing your joints.  You may have changes in your hair. These can include thickening of your hair, rapid growth, and changes in texture. Some women also have hair loss during or after pregnancy, or hair that  feels dry or thin. Your hair will most likely return to normal after your baby is born.  Your breasts will continue to grow and they will continue to become tender. A yellow fluid (colostrum) may leak from your breasts. This is the first milk you are producing for your baby.  Your belly button may stick out.  You may notice more swelling in your hands, face, or ankles.  You may have increased tingling or numbness in your hands, arms, and legs. The skin on your belly may also feel numb.  You may feel short of breath because of your expanding uterus.  You may have more problems sleeping. This can be caused by the size of your belly, increased need to urinate, and an increase in your body's metabolism.  You may notice the fetus "dropping," or moving lower in your abdomen (lightening).  You may have increased vaginal discharge.  You may notice your joints feel loose and you may have pain around your pelvic bone. What to expect at prenatal visits You will have prenatal exams every 2 weeks until week 36. Then you will have weekly prenatal exams. During a routine prenatal visit:  You will be weighed to make sure you and the baby are growing normally.  Your blood pressure will be taken.  Your abdomen will be measured to track your baby's growth.  The fetal heartbeat will be listened to.  Any test results from the previous visit will be discussed.  You may have a cervical check near your due date  to see if your cervix has softened or thinned (effaced).  You will be tested for Group B streptococcus. This happens between 35 and 37 weeks. Your health care provider may ask you:  What your birth plan is.  How you are feeling.  If you are feeling the baby move.  If you have had any abnormal symptoms, such as leaking fluid, bleeding, severe headaches, or abdominal cramping.  If you are using any tobacco products, including cigarettes, chewing tobacco, and electronic cigarettes.  If you  have any questions. Other tests or screenings that may be performed during your third trimester include:  Blood tests that check for low iron levels (anemia).  Fetal testing to check the health, activity level, and growth of the fetus. Testing is done if you have certain medical conditions or if there are problems during the pregnancy.  Nonstress test (NST). This test checks the health of your baby to make sure there are no signs of problems, such as the baby not getting enough oxygen. During this test, a belt is placed around your belly. The baby is made to move, and its heart rate is monitored during movement. What is false labor? False labor is a condition in which you feel small, irregular tightenings of the muscles in the womb (contractions) that usually go away with rest, changing position, or drinking water. These are called Braxton Hicks contractions. Contractions may last for hours, days, or even weeks before true labor sets in. If contractions come at regular intervals, become more frequent, increase in intensity, or become painful, you should see your health care provider. What are the signs of labor?  Abdominal cramps.  Regular contractions that start at 10 minutes apart and become stronger and more frequent with time.  Contractions that start on the top of the uterus and spread down to the lower abdomen and back.  Increased pelvic pressure and dull back pain.  A watery or bloody mucus discharge that comes from the vagina.  Leaking of amniotic fluid. This is also known as your "water breaking." It could be a slow trickle or a gush. Let your health care provider know if it has a color or strange odor. If you have any of these signs, call your health care provider right away, even if it is before your due date. Follow these instructions at home: Medicines  Follow your health care provider's instructions regarding medicine use. Specific medicines may be either safe or unsafe to  take during pregnancy.  Take a prenatal vitamin that contains at least 600 micrograms (mcg) of folic acid.  If you develop constipation, try taking a stool softener if your health care provider approves. Eating and drinking   Eat a balanced diet that includes fresh fruits and vegetables, whole grains, good sources of protein such as meat, eggs, or tofu, and low-fat dairy. Your health care provider will help you determine the amount of weight gain that is right for you.  Avoid raw meat and uncooked cheese. These carry germs that can cause birth defects in the baby.  If you have low calcium intake from food, talk to your health care provider about whether you should take a daily calcium supplement.  Eat four or five small meals rather than three large meals a day.  Limit foods that are high in fat and processed sugars, such as fried and sweet foods.  To prevent constipation: ? Drink enough fluid to keep your urine clear or pale yellow. ? Eat foods that are  high in fiber, such as fresh fruits and vegetables, whole grains, and beans. Activity  Exercise only as directed by your health care provider. Most women can continue their usual exercise routine during pregnancy. Try to exercise for 30 minutes at least 5 days a week. Stop exercising if you experience uterine contractions.  Avoid heavy lifting.  Do not exercise in extreme heat or humidity, or at high altitudes.  Wear low-heel, comfortable shoes.  Practice good posture.  You may continue to have sex unless your health care provider tells you otherwise. Relieving pain and discomfort  Take frequent breaks and rest with your legs elevated if you have leg cramps or low back pain.  Take warm sitz baths to soothe any pain or discomfort caused by hemorrhoids. Use hemorrhoid cream if your health care provider approves.  Wear a good support bra to prevent discomfort from breast tenderness.  If you develop varicose veins: ? Wear  support pantyhose or compression stockings as told by your healthcare provider. ? Elevate your feet for 15 minutes, 3-4 times a day. Prenatal care  Write down your questions. Take them to your prenatal visits.  Keep all your prenatal visits as told by your health care provider. This is important. Safety  Wear your seat belt at all times when driving.  Make a list of emergency phone numbers, including numbers for family, friends, the hospital, and police and fire departments. General instructions  Avoid cat litter boxes and soil used by cats. These carry germs that can cause birth defects in the baby. If you have a cat, ask someone to clean the litter box for you.  Do not travel far distances unless it is absolutely necessary and only with the approval of your health care provider.  Do not use hot tubs, steam rooms, or saunas.  Do not drink alcohol.  Do not use any products that contain nicotine or tobacco, such as cigarettes and e-cigarettes. If you need help quitting, ask your health care provider.  Do not use any medicinal herbs or unprescribed drugs. These chemicals affect the formation and growth of the baby.  Do not douche or use tampons or scented sanitary pads.  Do not cross your legs for long periods of time.  To prepare for the arrival of your baby: ? Take prenatal classes to understand, practice, and ask questions about labor and delivery. ? Make a trial run to the hospital. ? Visit the hospital and tour the maternity area. ? Arrange for maternity or paternity leave through employers. ? Arrange for family and friends to take care of pets while you are in the hospital. ? Purchase a rear-facing car seat and make sure you know how to install it in your car. ? Pack your hospital bag. ? Prepare the baby's nursery. Make sure to remove all pillows and stuffed animals from the baby's crib to prevent suffocation.  Visit your dentist if you have not gone during your pregnancy.  Use a soft toothbrush to brush your teeth and be gentle when you floss. Contact a health care provider if:  You are unsure if you are in labor or if your water has broken.  You become dizzy.  You have mild pelvic cramps, pelvic pressure, or nagging pain in your abdominal area.  You have lower back pain.  You have persistent nausea, vomiting, or diarrhea.  You have an unusual or bad smelling vaginal discharge.  You have pain when you urinate. Get help right away if:  Your water  breaks before 37 weeks.  You have regular contractions less than 5 minutes apart before 37 weeks.  You have a fever.  You are leaking fluid from your vagina.  You have spotting or bleeding from your vagina.  You have severe abdominal pain or cramping.  You have rapid weight loss or weight gain.  You have shortness of breath with chest pain.  You notice sudden or extreme swelling of your face, hands, ankles, feet, or legs.  Your baby makes fewer than 10 movements in 2 hours.  You have severe headaches that do not go away when you take medicine.  You have vision changes. Summary  The third trimester is from week 28 through week 40, months 7 through 9. The third trimester is a time when the unborn baby (fetus) is growing rapidly.  During the third trimester, your discomfort may increase as you and your baby continue to gain weight. You may have abdominal, leg, and back pain, sleeping problems, and an increased need to urinate.  During the third trimester your breasts will keep growing and they will continue to become tender. A yellow fluid (colostrum) may leak from your breasts. This is the first milk you are producing for your baby.  False labor is a condition in which you feel small, irregular tightenings of the muscles in the womb (contractions) that eventually go away. These are called Braxton Hicks contractions. Contractions may last for hours, days, or even weeks before true labor sets  in.  Signs of labor can include: abdominal cramps; regular contractions that start at 10 minutes apart and become stronger and more frequent with time; watery or bloody mucus discharge that comes from the vagina; increased pelvic pressure and dull back pain; and leaking of amniotic fluid. This information is not intended to replace advice given to you by your health care provider. Make sure you discuss any questions you have with your health care provider. Document Revised: 04/22/2018 Document Reviewed: 02/05/2016 Elsevier Patient Education  2020 [employer].

## 2019-08-31 ENCOUNTER — Other Ambulatory Visit: Payer: Self-pay

## 2019-08-31 ENCOUNTER — Ambulatory Visit (INDEPENDENT_AMBULATORY_CARE_PROVIDER_SITE_OTHER): Payer: Medicaid Other | Admitting: Family Medicine

## 2019-08-31 ENCOUNTER — Other Ambulatory Visit: Payer: Self-pay | Admitting: Advanced Practice Midwife

## 2019-08-31 VITALS — BP 118/87 | HR 97 | Wt 249.2 lb

## 2019-08-31 DIAGNOSIS — O3663X0 Maternal care for excessive fetal growth, third trimester, not applicable or unspecified: Secondary | ICD-10-CM

## 2019-08-31 DIAGNOSIS — B951 Streptococcus, group B, as the cause of diseases classified elsewhere: Secondary | ICD-10-CM

## 2019-08-31 DIAGNOSIS — O3660X0 Maternal care for excessive fetal growth, unspecified trimester, not applicable or unspecified: Secondary | ICD-10-CM | POA: Insufficient documentation

## 2019-08-31 DIAGNOSIS — Z98891 History of uterine scar from previous surgery: Secondary | ICD-10-CM

## 2019-08-31 DIAGNOSIS — O0990 Supervision of high risk pregnancy, unspecified, unspecified trimester: Secondary | ICD-10-CM

## 2019-08-31 DIAGNOSIS — Z8759 Personal history of other complications of pregnancy, childbirth and the puerperium: Secondary | ICD-10-CM

## 2019-08-31 NOTE — Progress Notes (Signed)
° °  PRENATAL VISIT NOTE  Subjective:  Sherri Clark is a 29 y.o. G2P0101 at [redacted]w[redacted]d being seen today for ongoing prenatal care.  She is currently monitored for the following issues for this high-risk pregnancy and has Supervision of high risk pregnancy, antepartum; History of gonorrhea; History of cesarean delivery; BMI 29.0-29.9,adult; History of gestational hypertension; Positive GBS test; and LGA (large for gestational age) fetus affecting management of mother on their problem list.  Patient reports backache.  Contractions: Not present. Vag. Bleeding: None.  Movement: Present. Denies leaking of fluid.   The following portions of the patient's history were reviewed and updated as appropriate: allergies, current medications, past family history, past medical history, past social history, past surgical history and problem list.   Objective:   Vitals:   08/31/19 0903  BP: 118/87  Pulse: 97  Weight: 249 lb 3.2 oz (113 kg)    Fetal Status: Fetal Heart Rate (bpm): 140 Fundal Height: 39 cm Movement: Present  Presentation: Vertex  General:  Alert, oriented and cooperative. Patient is in no acute distress.  Skin: Skin is warm and dry. No rash noted.   Cardiovascular: Normal heart rate noted  Respiratory: Normal respiratory effort, no problems with respiration noted  Abdomen: Soft, gravid, appropriate for gestational age.  Pain/Pressure: Present     Pelvic: Cervical exam performed in the presence of a chaperone Dilation: Closed Effacement (%): Thick    Extremities: Normal range of motion.  Edema: None  Mental Status: Normal mood and affect. Normal behavior. Normal judgment and thought content.   Assessment and Plan:  Pregnancy: G2P0101 at [redacted]w[redacted]d 1. History of cesarean delivery Still strongly desires TOLAC--has LGA--will move IOL to 39 wks. Cervix is closed will not be able to place oupt foley--will attempt low dose pitocin  2. History of gestational hypertension BP is ok, but did have 6  lb weight gain over last week  3. Positive GBS test Will need treatment in labor  4. Supervision of high risk pregnancy, antepartum Continue  prenatal care.  5. Excessive fetal growth affecting management of pregnancy in third trimester, single or unspecified fetus Husband is over 6 feet and she is 5 ft 7in.  Preterm labor symptoms and general obstetric precautions including but not limited to vaginal bleeding, contractions, leaking of fluid and fetal movement were reviewed in detail with the patient. Please refer to After Visit Summary for other counseling recommendations.   Return in 6 weeks (on 10/12/2019).  No future appointments.  Reva Bores, MD

## 2019-08-31 NOTE — Patient Instructions (Signed)

## 2019-09-04 ENCOUNTER — Inpatient Hospital Stay (HOSPITAL_COMMUNITY): Payer: Medicaid Other

## 2019-09-04 ENCOUNTER — Inpatient Hospital Stay (HOSPITAL_COMMUNITY)
Admission: AD | Admit: 2019-09-04 | Discharge: 2019-09-08 | DRG: 787 | Disposition: A | Discharge status: Home / Self Care | Payer: Medicaid Other | Attending: Obstetrics and Gynecology | Admitting: Obstetrics and Gynecology

## 2019-09-04 ENCOUNTER — Encounter (HOSPITAL_COMMUNITY): Payer: Self-pay | Admitting: Family Medicine

## 2019-09-04 ENCOUNTER — Other Ambulatory Visit: Payer: Self-pay

## 2019-09-04 DIAGNOSIS — O99824 Streptococcus B carrier state complicating childbirth: Secondary | ICD-10-CM | POA: Diagnosis present

## 2019-09-04 DIAGNOSIS — O3660X0 Maternal care for excessive fetal growth, unspecified trimester, not applicable or unspecified: Secondary | ICD-10-CM | POA: Diagnosis present

## 2019-09-04 DIAGNOSIS — Z3A39 39 weeks gestation of pregnancy: Secondary | ICD-10-CM

## 2019-09-04 DIAGNOSIS — Z3490 Encounter for supervision of normal pregnancy, unspecified, unspecified trimester: Secondary | ICD-10-CM

## 2019-09-04 DIAGNOSIS — O3663X0 Maternal care for excessive fetal growth, third trimester, not applicable or unspecified: Secondary | ICD-10-CM | POA: Diagnosis not present

## 2019-09-04 DIAGNOSIS — B951 Streptococcus, group B, as the cause of diseases classified elsewhere: Secondary | ICD-10-CM | POA: Diagnosis present

## 2019-09-04 DIAGNOSIS — D62 Acute posthemorrhagic anemia: Secondary | ICD-10-CM | POA: Diagnosis not present

## 2019-09-04 DIAGNOSIS — Z87891 Personal history of nicotine dependence: Secondary | ICD-10-CM | POA: Diagnosis not present

## 2019-09-04 DIAGNOSIS — Z20822 Contact with and (suspected) exposure to covid-19: Secondary | ICD-10-CM | POA: Diagnosis not present

## 2019-09-04 DIAGNOSIS — O0990 Supervision of high risk pregnancy, unspecified, unspecified trimester: Secondary | ICD-10-CM

## 2019-09-04 DIAGNOSIS — O9081 Anemia of the puerperium: Secondary | ICD-10-CM | POA: Diagnosis not present

## 2019-09-04 DIAGNOSIS — O34211 Maternal care for low transverse scar from previous cesarean delivery: Secondary | ICD-10-CM | POA: Diagnosis not present

## 2019-09-04 DIAGNOSIS — Z98891 History of uterine scar from previous surgery: Secondary | ICD-10-CM

## 2019-09-04 DIAGNOSIS — Z8619 Personal history of other infectious and parasitic diseases: Secondary | ICD-10-CM

## 2019-09-04 DIAGNOSIS — Z8759 Personal history of other complications of pregnancy, childbirth and the puerperium: Secondary | ICD-10-CM

## 2019-09-04 LAB — SARS CORONAVIRUS 2 BY RT PCR (HOSPITAL ORDER, PERFORMED IN ~~LOC~~ HOSPITAL LAB): SARS Coronavirus 2: NEGATIVE

## 2019-09-04 LAB — CBC
HCT: 37.1 % (ref 36.0–46.0)
Hemoglobin: 12.6 g/dL (ref 12.0–15.0)
MCH: 31.7 pg (ref 26.0–34.0)
MCHC: 34 g/dL (ref 30.0–36.0)
MCV: 93.5 fL (ref 80.0–100.0)
Platelets: 260 10*3/uL (ref 150–400)
RBC: 3.97 MIL/uL (ref 3.87–5.11)
RDW: 13.2 % (ref 11.5–15.5)
WBC: 6.3 10*3/uL (ref 4.0–10.5)
nRBC: 0 % (ref 0.0–0.2)

## 2019-09-04 LAB — TYPE AND SCREEN
ABO/RH(D): O POS
Antibody Screen: NEGATIVE

## 2019-09-04 LAB — RPR: RPR Ser Ql: NONREACTIVE

## 2019-09-04 MED ORDER — SOD CITRATE-CITRIC ACID 500-334 MG/5ML PO SOLN
30.0000 mL | ORAL | Status: DC | PRN
  Filled 2019-09-04: qty 30

## 2019-09-04 MED ORDER — SODIUM CHLORIDE 0.9 % IV SOLN
5.0000 10*6.[IU] | Freq: Once | INTRAVENOUS | Status: AC
  Administered 2019-09-04: 5 10*6.[IU] via INTRAVENOUS
  Filled 2019-09-04: qty 5

## 2019-09-04 MED ORDER — OXYCODONE-ACETAMINOPHEN 5-325 MG PO TABS: 1.0000 | ORAL_TABLET | ORAL | Status: DC | PRN

## 2019-09-04 MED ORDER — OXYTOCIN-SODIUM CHLORIDE 30-0.9 UT/500ML-% IV SOLN
2.5000 [IU]/h | INTRAVENOUS | Status: DC
  Administered 2019-09-05: 30 [IU] via INTRAVENOUS

## 2019-09-04 MED ORDER — ACETAMINOPHEN 325 MG PO TABS: 650.0000 mg | ORAL_TABLET | ORAL | Status: DC | PRN

## 2019-09-04 MED ORDER — OXYTOCIN-SODIUM CHLORIDE 30-0.9 UT/500ML-% IV SOLN
1.0000 m[IU]/min | INTRAVENOUS | Status: DC
  Administered 2019-09-04: 2 m[IU]/min via INTRAVENOUS
  Filled 2019-09-04: qty 500

## 2019-09-04 MED ORDER — LIDOCAINE HCL (PF) 1 % IJ SOLN: 30.0000 mL | INTRAMUSCULAR | Status: DC | PRN

## 2019-09-04 MED ORDER — FENTANYL CITRATE (PF) 100 MCG/2ML IJ SOLN
100.0000 ug | INTRAMUSCULAR | Status: DC | PRN
  Administered 2019-09-04 – 2019-09-05 (×6): 100 ug via INTRAVENOUS
  Filled 2019-09-04 (×6): qty 2

## 2019-09-04 MED ORDER — FLEET ENEMA 7-19 GM/118ML RE ENEM: 1.0000 | ENEMA | RECTAL | Status: DC | PRN

## 2019-09-04 MED ORDER — PENICILLIN G POT IN DEXTROSE 60000 UNIT/ML IV SOLN
3.0000 10*6.[IU] | INTRAVENOUS | Status: DC
  Administered 2019-09-04 – 2019-09-05 (×5): 3 10*6.[IU] via INTRAVENOUS
  Filled 2019-09-04 (×5): qty 50

## 2019-09-04 MED ORDER — ONDANSETRON HCL 4 MG/2ML IJ SOLN: 4.0000 mg | Freq: Four times a day (QID) | INTRAMUSCULAR | Status: DC | PRN

## 2019-09-04 MED ORDER — OXYCODONE-ACETAMINOPHEN 5-325 MG PO TABS: 2.0000 | ORAL_TABLET | ORAL | Status: DC | PRN

## 2019-09-04 MED ORDER — LACTATED RINGERS IV SOLN: INTRAVENOUS | Status: DC

## 2019-09-04 MED ORDER — LACTATED RINGERS IV SOLN: 500.0000 mL | INTRAVENOUS | Status: DC | PRN

## 2019-09-04 MED ORDER — TERBUTALINE SULFATE 1 MG/ML IJ SOLN: 0.2500 mg | Freq: Once | INTRAMUSCULAR | Status: DC | PRN

## 2019-09-04 MED ORDER — OXYTOCIN BOLUS FROM INFUSION: 333.0000 mL | Freq: Once | INTRAVENOUS | Status: DC

## 2019-09-04 NOTE — Progress Notes (Signed)
Labor Progress Note Sherri Clark is a 29 y.o. G2P0101 at [redacted]w[redacted]d presented for IOL for LGA, also TOLAC candidate  S: Contractions are not painful. IV is causing pain.    O:  BP 128/76   Pulse 68   Temp 98.4 F (36.9 C) (Oral)   Resp 18   Ht 5\' 7"  (1.702 m)   Wt 115.5 kg   LMP 11/28/2018 (Approximate)   BMI 39.88 kg/m  EFM: 130/mod variability/pos accels/neg decels Toco: q2-3 min   CVE: Dilation: Fingertip Effacement (%): Thick Station: -3 Presentation: Vertex Exam by:: [email or web address], RN   A&P: 29 y.o. [phone] [redacted]w[redacted]d here for IOL for LGA in setting of TOLAC  #Induction of Labor: Tolac candidate. SVE closed/long/high on admission. Pitocin started at 0109, currently running at 12. FB when able.    #LGA: EFW>99%ile, EFW 3907 g at [redacted]w[redacted]d, approx 9lb10oz by Leopold's. Dystocia precautions at delivery.   #Pain:  Epidural prn  #FWB: Cat I #ID: GBS pos, on PCN   [redacted]w[redacted]d, MD 6:46 AM

## 2019-09-04 NOTE — H&P (Addendum)
OBSTETRIC ADMISSION HISTORY AND PHYSICAL  Sherri Clark is a 29 y.o. female G68P0101 with IUP at [redacted]w[redacted]d by LMP c/b 6wk [place] presenting for IOL for LGA in setting of desiring TOLAC . She reports +FMs, No LOF, no VB, no blurry vision, headaches or peripheral edema, and RUQ pain.  She plans on breast feeding. She is considering a IUD for birth control. She received her prenatal care at [facility]   Dating: By 6wk [place] --->  Estimated Date of Delivery: 09/11/19  Sono:    '@[redacted]w[redacted]d'$ , CWD, normal anatomy, cephalic presentation, posterior placenta, 3907g, 99% EFW   Prenatal History/Complications:  TOLAC candidate, consent signed in office - prior C/S at [redacted]w[redacted]d for fetal distress  GBS positive Hx of gonorrhea in pregnancy, TOC 04/02/19 negative  LGA, see [place] above   Past Medical History: Past Medical History:  Diagnosis Date   Gonorrhea 2013   Pregnancy induced hypertension     Past Surgical History: Past Surgical History:  Procedure Laterality Date   CESAREAN SECTION     CHOLECYSTECTOMY     EYE SURGERY      Obstetrical History: OB History    Gravida  2   Para  1   Term      Preterm  1   AB      Living  1     SAB      TAB      Ectopic      Multiple      Live Births  1           Social History Social History   Socioeconomic History   Marital status: Single    Spouse name: Not on file   Number of children: 1   Years of education: Not on file   Highest education level: High school graduate  Occupational History   Not on file  Tobacco Use   Smoking status: Former Smoker    Types: Cigarettes    Quit date: 01/30/2017    Years since quitting: 2.5   Smokeless tobacco: Never Used  Vaping Use   Vaping Use: Never used  Substance and Sexual Activity   Alcohol use: Never   Drug use: Never   Sexual activity: Yes  Other Topics Concern   Not on file  Social History Narrative   Not on file   Social Determinants of Health   Financial Resource  Strain:    Difficulty of Paying Living Expenses: Not on file  Food Insecurity: No Food Insecurity   Worried About Running Out of Food in the Last Year: Never true   [place] in the Last Year: Never true  Transportation Needs: No Transportation Needs   Lack of Transportation (Medical): No   Lack of Transportation (Non-Medical): No  Physical Activity:    Days of Exercise per Week: Not on file   Minutes of Exercise per Session: Not on file  Stress:    Feeling of Stress : Not on file  Social Connections:    Frequency of Communication with Friends and Family: Not on file   Frequency of Social Gatherings with Friends and Family: Not on file   Attends Religious Services: Not on file   Active Member of Clubs or Organizations: Not on file   Attends Archivist Meetings: Not on file   Marital Status: Not on file    Family History: Family History  Problem Relation Age of Onset   Lupus Mother    Hypertension Father  Allergies: No Known Allergies  Medications Prior to Admission  Medication Sig Dispense Refill Last Dose   acetaminophen (TYLENOL) 500 MG tablet Take 500 mg by mouth every 6 (six) hours as needed for mild pain or moderate pain.    09/03/2019 at Unknown time   cetirizine (ZYRTEC) 10 MG tablet Take 10 mg by mouth daily.   Past Month at Unknown time   oxyCODONE-acetaminophen (PERCOCET/ROXICET) 5-325 MG tablet Take by mouth every 4 (four) hours as needed for severe pain.   Past Week at Unknown time   Prenatal Vit-Fe Fumarate-FA (PREPLUS) 27-1 MG TABS Take 1 tablet by mouth daily. 30 tablet 6 09/03/2019 at Unknown time   Blood Pressure Monitoring (BLOOD PRESSURE KIT) DEVI 1 Device by Does not apply route as needed. 1 each 0      Review of Systems   All systems reviewed and negative except as stated in HPI  Blood pressure 128/80, pulse 82, temperature 98.9 F (37.2 C), temperature source Oral, resp. rate 16, height $RemoveBe'5\' 7"'QqGRdIxvt$  (1.702 m), weight  115.5 kg, last menstrual period 11/28/2018, unknown if currently breastfeeding. General appearance: alert, cooperative and appears stated age Lungs: clear to auscultation bilaterally Heart: regular rate and rhythm Abdomen: soft, non-tender; bowel sounds normal Pelvic: closed/thick/ballotable per MJ RN  Extremities: Homans sign is negative, no sign of DVT Presentation: cephalic Fetal monitoring: Baseline 120, mod variability, pos accels, neg decels  Uterine activity: q10 min  Dilation: Closed Effacement (%): Thick Station: Ballotable Exam by:: Mary Martinique Johnson, RN   Cephalic by [place]   Prenatal labs: ABO, Rh: --/--/PENDING (08/22 0040) Antibody: PENDING (08/22 0040) Rubella: 1.13 (02/15 0949) RPR: Non Reactive (06/10 0856)  HBsAg: Negative (02/15 0949)  HIV: Non Reactive (06/10 0856)  GBS: Positive/-- (08/03 1523)  2 hr Glucola normal Genetic screening  Low risk  Anatomy US Normal   Prenatal Transfer Tool  Maternal Diabetes: No Genetic Screening: Normal Maternal Ultrasounds/Referrals: Normal Fetal Ultrasounds or other Referrals:  None Maternal Substance Abuse:  No Significant Maternal Medications:  None Significant Maternal Lab Results: Group B Strep positive  Results for orders placed or performed during the hospital encounter of 09/04/19 (from the past 24 hour(s))  Type and screen   Collection Time: 09/04/19 12:40 AM  Result Value Ref Range   ABO/RH(D) PENDING    Antibody Screen PENDING    Sample Expiration      09/07/2019,2359 Performed at [facility], [address] [place], [place] [address]     Patient Active Problem List   Diagnosis Date Noted   Term pregnancy 09/04/2019   LGA (large for gestational age) fetus affecting management of mother 08/31/2019   Positive GBS test 08/20/2019   History of gestational hypertension 03/29/2019   History of cesarean delivery 02/28/2019   BMI 29.0-29.9,adult 02/28/2019   Supervision of high risk  pregnancy, antepartum 01/31/2019   History of gonorrhea 01/31/2019    Assessment/Plan:  Breiona Clark is a 29 y.o. G2P0101 at [redacted]w[redacted]d here for TOLAC/IOL for LGA.   #Induction of Labor: Tolac candidate (consent signed), SVE closed/long/high. Vertex by [place]. Will proceed with Pitocin. FB when able.    #LGA: EFW>99%ile, EFW 3907 g at 100w4d, approx 9lb10oz by Leopold's. Dystocia precautions at delivery.   #Pain: Epidural prn  #FWB: Cat I #ID:  GBS pos, start PCN w FB insertion  #MOF: Breast #MOC: considering LARC #Circ:  Yes   Janet Berlin, MD  09/04/2019, 1:16 AM

## 2019-09-04 NOTE — Progress Notes (Signed)
Nalini Alcaraz is a 29 y.o. G2P0101 at [redacted]w[redacted]d.  Subjective: Increased discomfort w/ contractions. Coping well.   Objective: BP 126/76   Pulse 78   Temp 98.4 F (36.9 C) (Axillary)   Resp 18   Ht 5\' 7"  (1.702 m)   Wt 115.5 kg   LMP 11/28/2018 (Approximate)   BMI 39.88 kg/m    FHT:  FHR: 130 bpm, variability: mod,  accelerations:  15x15,  decelerations:  none UC:   Q 2-4 minutes, mod Dilation: Fingertip Effacement (%): 50 Station: -2 Presentation: Vertex Exam by:: [email or web address], CNM Foley balloon placed w/ speculum and ring forceps. Placement verified by VE. Pt tolerated well.   Labs: NA  Assessment / Plan: [redacted]w[redacted]d week IUP Labor: Early Fetal Wellbeing:  Category I Pain Control:  Comfort measures. May shower Anticipated MOD:  SVD  [redacted]w[redacted]d Katrinka Blazing, [place] 09/04/2019 5:53 PM

## 2019-09-04 NOTE — Progress Notes (Signed)
Sherri Clark is a 29 y.o. G2P0101 at [redacted]w[redacted]d.  Subjective:  Extremely tired, needs to pause and breathe through contractions.   Objective: BP 135/87   Pulse 89   Temp 98.6 F (37 C) (Oral)   Resp 18   Ht 5\' 7"  (1.702 m)   Wt 115.5 kg   LMP 11/28/2018 (Approximate)   BMI 39.88 kg/m    FHT:  FHR: 120 bpm, variability: mod, pos accels, decelerations:  none UC:   Q 2-3 minutes, mod Dilation: Fingertip Effacement (%): 50 Station: -2 Presentation: Vertex Exam by:: [email or web address], CNM  (FB in place)   Labs: NA  Assessment / Plan: 29 y.o. G2P0101 [redacted]w[redacted]d here for IOL for LGA in setting of TOLAC  #Induction ofLabor:Tolac candidate. SVE closed/long/high on admission. Pitocin started at 0109, currently running at 14. FB placed at 1700. Continue to monitor.  #LGA: EFW>99%ile, EFW 3907 g at [redacted]w[redacted]d, approx 9lb10oz by Leopold's. Dystocia precautions at delivery.  #Pain:Has received IV fentanyl x2.  #FWB:Cat I #ID:GBS pos, PCN when FB out.   [redacted]w[redacted]d, MD 09/04/2019 11:20 PM

## 2019-09-04 NOTE — Progress Notes (Signed)
Labor Progress Note Sherri Clark is a 29 y.o. G2P0101 at [redacted]w[redacted]d presented for IOL for LGA.  She is a TOLAC candidate.   S: She is resting comfortably with her support people are the bedside.  She is mildly uncomfortable.  She is open to the Foley balloon insertion should her cervix be favorable enough to do so. She, nor her support people, have any questions or concerns at this time.    O:  BP 129/87   Pulse 73   Temp 98.5 F (36.9 C) (Oral)   Resp 18   Ht 5\' 7"  (1.702 m)   Wt 115.5 kg   LMP 11/28/2018 (Approximate)   BMI 39.88 kg/m  EFM: 140/mod/accles present/no decels Mild, infrequent contractions   CVE: Dilation: Fingertip Effacement (%): Thick Station: -3 Presentation: Vertex Exam by:: [email or web address], CNM  A&P: 29 y.o. G2P0101 [redacted]w[redacted]d IOL for LGA  #Labor: latent  #Pain: coping well currently  #FWB: continuous fetal monitoring  #GBS positive  Not favorable to insert a balloon at this point  Will continue pitocin at 12 for now May consider a Pitocin pause and rest later this afternoon if no change has been evident  Continue to monitor and offer comfort measures    June, Student-MidWife 12:34 PM

## 2019-09-05 ENCOUNTER — Encounter (HOSPITAL_COMMUNITY): Payer: Self-pay | Admitting: Family Medicine

## 2019-09-05 ENCOUNTER — Inpatient Hospital Stay (HOSPITAL_COMMUNITY): Payer: Medicaid Other | Admitting: Anesthesiology

## 2019-09-05 ENCOUNTER — Encounter (HOSPITAL_COMMUNITY)
Admission: AD | Disposition: A | Discharge status: Home / Self Care | Payer: Self-pay | Source: Home / Self Care | Attending: Obstetrics and Gynecology

## 2019-09-05 DIAGNOSIS — O34211 Maternal care for low transverse scar from previous cesarean delivery: Secondary | ICD-10-CM | POA: Diagnosis not present

## 2019-09-05 DIAGNOSIS — Z87891 Personal history of nicotine dependence: Secondary | ICD-10-CM | POA: Diagnosis not present

## 2019-09-05 DIAGNOSIS — Z20822 Contact with and (suspected) exposure to covid-19: Secondary | ICD-10-CM | POA: Diagnosis not present

## 2019-09-05 DIAGNOSIS — D62 Acute posthemorrhagic anemia: Secondary | ICD-10-CM | POA: Diagnosis not present

## 2019-09-05 DIAGNOSIS — O34219 Maternal care for unspecified type scar from previous cesarean delivery: Secondary | ICD-10-CM | POA: Diagnosis not present

## 2019-09-05 DIAGNOSIS — O3663X0 Maternal care for excessive fetal growth, third trimester, not applicable or unspecified: Secondary | ICD-10-CM | POA: Diagnosis not present

## 2019-09-05 DIAGNOSIS — O99824 Streptococcus B carrier state complicating childbirth: Secondary | ICD-10-CM | POA: Diagnosis not present

## 2019-09-05 DIAGNOSIS — Z3A39 39 weeks gestation of pregnancy: Secondary | ICD-10-CM | POA: Diagnosis not present

## 2019-09-05 DIAGNOSIS — O9081 Anemia of the puerperium: Secondary | ICD-10-CM | POA: Diagnosis not present

## 2019-09-05 SURGERY — Surgical Case
Anesthesia: Spinal

## 2019-09-05 MED ORDER — PHENYLEPHRINE HCL-NACL 20-0.9 MG/250ML-% IV SOLN
INTRAVENOUS | Status: DC | PRN
  Administered 2019-09-05: 60 ug/min via INTRAVENOUS

## 2019-09-05 MED ORDER — PHENYLEPHRINE HCL-NACL 20-0.9 MG/250ML-% IV SOLN
INTRAVENOUS | Status: AC
  Filled 2019-09-05: qty 250

## 2019-09-05 MED ORDER — NALOXONE HCL 4 MG/10ML IJ SOLN
1.0000 ug/kg/h | INTRAVENOUS | Status: DC | PRN
  Filled 2019-09-05: qty 5

## 2019-09-05 MED ORDER — LIDOCAINE HCL (PF) 2 % IJ SOLN
INTRAMUSCULAR | Status: AC
  Filled 2019-09-05: qty 40

## 2019-09-05 MED ORDER — DIBUCAINE (PERIANAL) 1 % EX OINT: 1.0000 "application " | TOPICAL_OINTMENT | CUTANEOUS | Status: DC | PRN

## 2019-09-05 MED ORDER — KETOROLAC TROMETHAMINE 30 MG/ML IJ SOLN
30.0000 mg | Freq: Four times a day (QID) | INTRAMUSCULAR | Status: AC | PRN
  Administered 2019-09-05: 30 mg via INTRAVENOUS
  Filled 2019-09-05: qty 1

## 2019-09-05 MED ORDER — ONDANSETRON HCL 4 MG/2ML IJ SOLN: 4.0000 mg | Freq: Once | INTRAMUSCULAR | Status: DC | PRN

## 2019-09-05 MED ORDER — SOD CITRATE-CITRIC ACID 500-334 MG/5ML PO SOLN
30.0000 mL | ORAL | Status: AC
  Administered 2019-09-05: 30 mL via ORAL

## 2019-09-05 MED ORDER — FENTANYL CITRATE (PF) 100 MCG/2ML IJ SOLN
INTRAMUSCULAR | Status: AC
  Filled 2019-09-05: qty 2

## 2019-09-05 MED ORDER — KETOROLAC TROMETHAMINE 30 MG/ML IJ SOLN
INTRAMUSCULAR | Status: AC
  Filled 2019-09-05: qty 1

## 2019-09-05 MED ORDER — DEXAMETHASONE SODIUM PHOSPHATE 4 MG/ML IJ SOLN
INTRAMUSCULAR | Status: DC | PRN
  Administered 2019-09-05: 4 mg via INTRAVENOUS

## 2019-09-05 MED ORDER — SODIUM CHLORIDE 0.9 % IV SOLN: INTRAVENOUS | Status: DC | PRN

## 2019-09-05 MED ORDER — OXYTOCIN-SODIUM CHLORIDE 30-0.9 UT/500ML-% IV SOLN: 2.5000 [IU]/h | INTRAVENOUS | Status: AC

## 2019-09-05 MED ORDER — NALBUPHINE HCL 10 MG/ML IJ SOLN: 5.0000 mg | Freq: Once | INTRAMUSCULAR | Status: DC | PRN

## 2019-09-05 MED ORDER — NALBUPHINE HCL 10 MG/ML IJ SOLN: 5.0000 mg | INTRAMUSCULAR | Status: DC | PRN

## 2019-09-05 MED ORDER — CEFAZOLIN SODIUM-DEXTROSE 2-4 GM/100ML-% IV SOLN: 2.0000 g | Freq: Once | INTRAVENOUS | Status: DC

## 2019-09-05 MED ORDER — WITCH HAZEL-GLYCERIN EX PADS: 1.0000 "application " | MEDICATED_PAD | CUTANEOUS | Status: DC | PRN

## 2019-09-05 MED ORDER — OXYCODONE HCL 5 MG PO TABS: 5.0000 mg | ORAL_TABLET | Freq: Once | ORAL | Status: DC | PRN

## 2019-09-05 MED ORDER — GABAPENTIN 100 MG PO CAPS
100.0000 mg | ORAL_CAPSULE | Freq: Three times a day (TID) | ORAL | Status: DC
  Administered 2019-09-05 – 2019-09-08 (×10): 100 mg via ORAL
  Filled 2019-09-05 (×10): qty 1

## 2019-09-05 MED ORDER — SODIUM CHLORIDE 0.9 % IR SOLN
Status: DC | PRN
  Administered 2019-09-05: 1000 mL

## 2019-09-05 MED ORDER — DIPHENHYDRAMINE HCL 25 MG PO CAPS: 25.0000 mg | ORAL_CAPSULE | Freq: Four times a day (QID) | ORAL | Status: DC | PRN

## 2019-09-05 MED ORDER — NALOXONE HCL 0.4 MG/ML IJ SOLN: 0.4000 mg | INTRAMUSCULAR | Status: DC | PRN

## 2019-09-05 MED ORDER — SIMETHICONE 80 MG PO CHEW
80.0000 mg | CHEWABLE_TABLET | ORAL | Status: DC
  Administered 2019-09-05 – 2019-09-07 (×3): 80 mg via ORAL
  Filled 2019-09-05 (×3): qty 1

## 2019-09-05 MED ORDER — KETOROLAC TROMETHAMINE 30 MG/ML IJ SOLN
30.0000 mg | Freq: Four times a day (QID) | INTRAMUSCULAR | Status: AC | PRN
  Administered 2019-09-05: 30 mg via INTRAMUSCULAR

## 2019-09-05 MED ORDER — SCOPOLAMINE 1 MG/3DAYS TD PT72
MEDICATED_PATCH | TRANSDERMAL | Status: AC
  Filled 2019-09-05: qty 1

## 2019-09-05 MED ORDER — SCOPOLAMINE 1 MG/3DAYS TD PT72
1.0000 | MEDICATED_PATCH | Freq: Once | TRANSDERMAL | Status: AC
  Administered 2019-09-05: 1.5 mg via TRANSDERMAL

## 2019-09-05 MED ORDER — SIMETHICONE 80 MG PO CHEW: 80.0000 mg | CHEWABLE_TABLET | ORAL | Status: DC | PRN

## 2019-09-05 MED ORDER — SENNOSIDES-DOCUSATE SODIUM 8.6-50 MG PO TABS
2.0000 | ORAL_TABLET | ORAL | Status: DC
  Administered 2019-09-05 – 2019-09-07 (×3): 2 via ORAL
  Filled 2019-09-05 (×3): qty 2

## 2019-09-05 MED ORDER — ONDANSETRON HCL 4 MG/2ML IJ SOLN: 4.0000 mg | Freq: Three times a day (TID) | INTRAMUSCULAR | Status: DC | PRN

## 2019-09-05 MED ORDER — FENTANYL CITRATE (PF) 100 MCG/2ML IJ SOLN: 25.0000 ug | INTRAMUSCULAR | Status: DC | PRN

## 2019-09-05 MED ORDER — MENTHOL 3 MG MT LOZG: 1.0000 | LOZENGE | OROMUCOSAL | Status: DC | PRN

## 2019-09-05 MED ORDER — ENOXAPARIN SODIUM 60 MG/0.6ML ~~LOC~~ SOLN
60.0000 mg | SUBCUTANEOUS | Status: DC
  Administered 2019-09-06 – 2019-09-08 (×2): 60 mg via SUBCUTANEOUS
  Filled 2019-09-05 (×3): qty 0.6

## 2019-09-05 MED ORDER — DIPHENHYDRAMINE HCL 25 MG PO CAPS: 25.0000 mg | ORAL_CAPSULE | ORAL | Status: DC | PRN

## 2019-09-05 MED ORDER — DIPHENHYDRAMINE HCL 50 MG/ML IJ SOLN: 12.5000 mg | INTRAMUSCULAR | Status: DC | PRN

## 2019-09-05 MED ORDER — BUPIVACAINE IN DEXTROSE 0.75-8.25 % IT SOLN
INTRATHECAL | Status: DC | PRN
  Administered 2019-09-05: 1.6 mL via INTRATHECAL

## 2019-09-05 MED ORDER — TETANUS-DIPHTH-ACELL PERTUSSIS 5-2.5-18.5 LF-MCG/0.5 IM SUSP: 0.5000 mL | Freq: Once | INTRAMUSCULAR | Status: DC

## 2019-09-05 MED ORDER — PHENYLEPHRINE 40 MCG/ML (10ML) SYRINGE FOR IV PUSH (FOR BLOOD PRESSURE SUPPORT)
PREFILLED_SYRINGE | INTRAVENOUS | Status: AC
  Filled 2019-09-05: qty 10

## 2019-09-05 MED ORDER — SODIUM CHLORIDE 0.9 % IV SOLN
500.0000 mg | INTRAVENOUS | Status: AC
  Administered 2019-09-05: 500 mg via INTRAVENOUS

## 2019-09-05 MED ORDER — OXYCODONE-ACETAMINOPHEN 5-325 MG PO TABS
1.0000 | ORAL_TABLET | ORAL | Status: DC | PRN
  Administered 2019-09-05: 1 via ORAL
  Administered 2019-09-06 – 2019-09-08 (×9): 2 via ORAL
  Filled 2019-09-05 (×5): qty 2
  Filled 2019-09-05 (×2): qty 1
  Filled 2019-09-05 (×3): qty 2

## 2019-09-05 MED ORDER — FENTANYL CITRATE (PF) 100 MCG/2ML IJ SOLN
INTRAMUSCULAR | Status: DC | PRN
Start: 2019-09-05 — End: 2019-09-05
  Administered 2019-09-05: 15 ug via INTRATHECAL

## 2019-09-05 MED ORDER — MEPERIDINE HCL 25 MG/ML IJ SOLN: 6.2500 mg | INTRAMUSCULAR | Status: DC | PRN

## 2019-09-05 MED ORDER — SODIUM CHLORIDE 0.9 % IV SOLN
INTRAVENOUS | Status: AC
  Filled 2019-09-05: qty 500

## 2019-09-05 MED ORDER — MORPHINE SULFATE (PF) 0.5 MG/ML IJ SOLN
INTRAMUSCULAR | Status: DC | PRN
Start: 2019-09-05 — End: 2019-09-05
  Administered 2019-09-05: .15 mg via INTRATHECAL

## 2019-09-05 MED ORDER — SIMETHICONE 80 MG PO CHEW
80.0000 mg | CHEWABLE_TABLET | Freq: Three times a day (TID) | ORAL | Status: DC
  Administered 2019-09-05 – 2019-09-08 (×9): 80 mg via ORAL
  Filled 2019-09-05 (×10): qty 1

## 2019-09-05 MED ORDER — LACTATED RINGERS IV SOLN: INTRAVENOUS | Status: DC

## 2019-09-05 MED ORDER — MORPHINE SULFATE (PF) 0.5 MG/ML IJ SOLN
INTRAMUSCULAR | Status: AC
  Filled 2019-09-05: qty 10

## 2019-09-05 MED ORDER — PHENYLEPHRINE HCL (PRESSORS) 10 MG/ML IV SOLN
INTRAVENOUS | Status: DC | PRN
  Administered 2019-09-05 (×3): 80 ug via INTRAVENOUS

## 2019-09-05 MED ORDER — COCONUT OIL OIL
1.0000 "application " | TOPICAL_OIL | Status: DC | PRN
  Administered 2019-09-08: 1 via TOPICAL

## 2019-09-05 MED ORDER — SODIUM CHLORIDE 0.9% FLUSH: 3.0000 mL | INTRAVENOUS | Status: DC | PRN

## 2019-09-05 MED ORDER — OXYCODONE HCL 5 MG/5ML PO SOLN: 5.0000 mg | Freq: Once | ORAL | Status: DC | PRN

## 2019-09-05 MED ORDER — CEFAZOLIN SODIUM-DEXTROSE 2-3 GM-%(50ML) IV SOLR
INTRAVENOUS | Status: DC | PRN
  Administered 2019-09-05: 2 g via INTRAVENOUS

## 2019-09-05 MED ORDER — ONDANSETRON HCL 4 MG/2ML IJ SOLN
INTRAMUSCULAR | Status: DC | PRN
  Administered 2019-09-05: 4 mg via INTRAVENOUS

## 2019-09-05 MED ORDER — PROPOFOL 10 MG/ML IV BOLUS
INTRAVENOUS | Status: AC
  Filled 2019-09-05: qty 40

## 2019-09-05 MED ORDER — IBUPROFEN 800 MG PO TABS
800.0000 mg | ORAL_TABLET | Freq: Three times a day (TID) | ORAL | Status: DC
  Administered 2019-09-05 – 2019-09-08 (×8): 800 mg via ORAL
  Filled 2019-09-05 (×8): qty 1

## 2019-09-05 MED ORDER — PRENATAL MULTIVITAMIN CH
1.0000 | ORAL_TABLET | Freq: Every day | ORAL | Status: DC
  Administered 2019-09-05 – 2019-09-08 (×4): 1 via ORAL
  Filled 2019-09-05 (×4): qty 1

## 2019-09-05 SURGICAL SUPPLY — 32 items
BENZOIN TINCTURE PRP APPL 2/3 (GAUZE/BANDAGES/DRESSINGS) ×2 IMPLANT
CHLORAPREP W/TINT 26ML (MISCELLANEOUS) ×2 IMPLANT
CLAMP CORD UMBIL (MISCELLANEOUS) IMPLANT
CLSR STERI-STRIP ANTIMIC 1/2X4 (GAUZE/BANDAGES/DRESSINGS) ×2 IMPLANT
DRSG OPSITE POSTOP 4X10 (GAUZE/BANDAGES/DRESSINGS) ×2 IMPLANT
ELECT REM PT RETURN 9FT ADLT (ELECTROSURGICAL) ×2
ELECTRODE REM PT RTRN 9FT ADLT (ELECTROSURGICAL) ×1 IMPLANT
EXTRACTOR VACUUM M CUP 4 TUBE (SUCTIONS) IMPLANT
GLOVE BIOGEL PI IND STRL 6.5 (GLOVE) ×1 IMPLANT
GLOVE BIOGEL PI IND STRL 7.0 (GLOVE) ×1 IMPLANT
GLOVE BIOGEL PI INDICATOR 6.5 (GLOVE) ×1
GLOVE BIOGEL PI INDICATOR 7.0 (GLOVE) ×1
GLOVE SURG SS PI 6.5 STRL IVOR (GLOVE) ×2 IMPLANT
GOWN STRL REUS W/TWL LRG LVL3 (GOWN DISPOSABLE) ×4 IMPLANT
HOVERMATT SINGLE USE (MISCELLANEOUS) ×2 IMPLANT
KIT ABG SYR 3ML LUER SLIP (SYRINGE) IMPLANT
NEEDLE HYPO 25X5/8 SAFETYGLIDE (NEEDLE) IMPLANT
NS IRRIG 1000ML POUR BTL (IV SOLUTION) ×2 IMPLANT
PACK C SECTION WH (CUSTOM PROCEDURE TRAY) ×2 IMPLANT
PAD ABD 7.5X8 STRL (GAUZE/BANDAGES/DRESSINGS) ×2 IMPLANT
PAD OB MATERNITY 4.3X12.25 (PERSONAL CARE ITEMS) ×2 IMPLANT
PENCIL SMOKE EVAC W/HOLSTER (ELECTROSURGICAL) ×2 IMPLANT
RETRACTOR TRAXI PANNICULUS (MISCELLANEOUS) ×1 IMPLANT
RTRCTR C-SECT PINK 25CM LRG (MISCELLANEOUS) IMPLANT
SPONGE GAUZE 4X4 16PLY NONSTR (GAUZE/BANDAGES/DRESSINGS) ×4 IMPLANT
SUT PLAIN 0 NONE (SUTURE) IMPLANT
SUT VIC AB 0 CT1 36 (SUTURE) ×8 IMPLANT
SUT VIC AB 4-0 KS 27 (SUTURE) ×2 IMPLANT
TOWEL OR 17X24 6PK STRL BLUE (TOWEL DISPOSABLE) ×2 IMPLANT
TRAXI PANNICULUS RETRACTOR (MISCELLANEOUS) ×1
TRAY FOLEY W/BAG SLVR 14FR LF (SET/KITS/TRAYS/PACK) ×2 IMPLANT
WATER STERILE IRR 1000ML POUR (IV SOLUTION) ×2 IMPLANT

## 2019-09-05 NOTE — Anesthesia Postprocedure Evaluation (Signed)
Anesthesia Post Note  Patient: Eulene Jump  Procedure(s) Performed: CESAREAN SECTION (N/A )     Patient location during evaluation: PACU Anesthesia Type: Spinal Level of consciousness: oriented and awake and alert Pain management: pain level controlled Vital Signs Assessment: post-procedure vital signs reviewed and stable Respiratory status: spontaneous breathing, respiratory function stable and patient connected to nasal cannula oxygen Cardiovascular status: blood pressure returned to baseline and stable Postop Assessment: no headache, no backache and no apparent nausea or vomiting Anesthetic complications: no   No complications documented.  Last Vitals:  Vitals:   09/05/19 0815 09/05/19 0830  BP: 118/62 127/78  Pulse: 73 80  Resp: 16 16  Temp:  37.2 C  SpO2: 100% 100%    Last Pain:  Vitals:   09/05/19 0830  TempSrc: Oral  PainSc:    Pain Goal:    LLE Motor Response: Purposeful movement (09/05/19 0815)   RLE Motor Response: Purposeful movement (09/05/19 0815)       Epidural/Spinal Function Cutaneous sensation: Tingles (09/05/19 0815), Patient able to flex knees: Yes (09/05/19 0815), Patient able to lift hips off bed: No (09/05/19 0815), Back pain beyond tenderness at insertion site: No (09/05/19 0815), Progressively worsening motor and/or sensory loss: No (09/05/19 0815), Bowel and/or bladder incontinence post epidural: No (09/05/19 0815)  Kieley Akter

## 2019-09-05 NOTE — Progress Notes (Addendum)
Patient called nurse to room and said "I felt something large come out" while sitting on the toilet. Nurse was able to collect a golf ball sized clot from the toilet. Fetal monitor was adjusted at this time and FHR tracing showed category II tracing.Patient is alert and oriented. Vitals signs are stable: BP 127/77, HR: 90, SP02 97%.  Provider will be notified of clot and nurse will continue to monitor any further bleeding.

## 2019-09-05 NOTE — Discharge Summary (Signed)
Postpartum Discharge Summary    Patient Name: Sherri Clark DOB: 1990/07/18 MRN: [id number]  Date of admission: 09/04/2019 Delivery date:09/05/2019  Delivering provider: CONSTANT, PEGGY  Date of discharge: 09/08/2019  Admitting diagnosis: Term pregnancy [Z34.90] Intrauterine pregnancy: [redacted]w[redacted]d     Secondary diagnosis:  Active Problems:   History of cesarean delivery   Positive GBS test   LGA (large for gestational age) fetus affecting management of mother   Term pregnancy  Additional problems: none    Discharge diagnosis: Term Pregnancy Delivered                                              Post partum procedures:none Augmentation: Pitocin and IP Foley Complications: None  Hospital course: Induction of Labor With Cesarean Section   29 y.o. yo G2P1102 at [redacted]w[redacted]d was admitted to the hospital 09/04/2019 for induction of labor. Initially admitted and was closed/long/high, started on pitocin and FB was subsequently placed. After approximately 24 hours of induction and being 3/50/-3, patient requested to stop induction and have elective repeat LTCS instead.   The patient went for cesarean section due to Elective Repeat. Delivery details are as follows:   Membrane Rupture Time/Date: 6:29 AM ,09/05/2019   Delivery Method:C-Section, Low Transverse  Details of operation can be found in separate operative Note.  Patient had an uncomplicated postpartum course but was started on Norvasc $RemoveBe'5mg'yPgixdMsC$  daily given elevated blood pressures in postpartum period. She is ambulating, tolerating a regular diet, passing flatus, and urinating well.  Patient is discharged home in stable condition on 09/08/19.      Newborn Data: Birth date:09/05/2019  Birth time:6:29 AM  Gender:Female  Living status:Living  Apgars:9 ,9  Weight:3750 g                                 Magnesium Sulfate received: No BMZ received: No Rhophylac:N/A MMR:No T-DaP:Given prenatally Flu: No Transfusion:No  Physical exam  Vitals:    09/07/19 1052 09/07/19 1315 09/07/19 2352 09/08/19 0503  BP: 132/74 134/82 118/75 123/74  Pulse:  (!) 108 86 83  Resp:  $Remo'18 16 16  'eQlse$ Temp:  99.3 F (37.4 C) 98.6 F (37 C) 98.4 F (36.9 C)  TempSrc:  Oral Oral Oral  SpO2:   98% 97%  Weight:      Height:       General: alert, cooperative and no distress Lochia: appropriate Uterine Fundus: firm and minimal tenderness Incision: Healing well with no significant drainage, No significant erythema, some dark blood visible along abdominal dressing DVT Evaluation: No evidence of DVT seen on physical exam. No cords or calf tenderness. 1+ pitting edema bilaterally.  Labs: Lab Results  Component Value Date   WBC 9.7 09/06/2019   HGB 9.8 (L) 09/06/2019   HCT 30.1 (L) 09/06/2019   MCV 96.8 09/06/2019   PLT 194 09/06/2019   CMP Latest Ref Rng & Units 09/06/2019  Glucose 70 - 99 mg/dL -  BUN 6 - 20 mg/dL -  Creatinine 0.44 - 1.00 mg/dL 0.76  Sodium 135 - 145 mmol/L -  Potassium 3.5 - 5.1 mmol/L -  Chloride 98 - 111 mmol/L -  CO2 22 - 32 mmol/L -  Calcium 8.9 - 10.3 mg/dL -  Total Protein 6.5 - 8.1 g/dL -  Total Bilirubin 0.3 - 1.2  mg/dL -  Alkaline Phos 38 - 126 U/L -  AST 15 - 41 U/L -  ALT 0 - 44 U/L -   Edinburgh Score: Edinburgh Postnatal Depression Scale Screening Tool 09/07/2019  I have been able to laugh and see the funny side of things. 0  I have looked forward with enjoyment to things. 0  I have blamed myself unnecessarily when things went wrong. 1  I have been anxious or worried for no good reason. 2  I have felt scared or panicky for no good reason. 0  Things have been getting on top of me. 0  I have been so unhappy that I have had difficulty sleeping. 0  I have felt sad or miserable. 0  I have been so unhappy that I have been crying. 0  The thought of harming myself has occurred to me. 0  Edinburgh Postnatal Depression Scale Total 3     After visit meds:  Allergies as of 09/08/2019   No Known Allergies      Medication List    STOP taking these medications   cetirizine 10 MG tablet Commonly known as: ZYRTEC   oxyCODONE-acetaminophen 5-325 MG tablet Commonly known as: PERCOCET/ROXICET     TAKE these medications   acetaminophen 500 MG tablet Commonly known as: TYLENOL Take 500 mg by mouth every 6 (six) hours as needed for mild pain or moderate pain.   amLODipine 5 MG tablet Commonly known as: NORVASC Take 1 tablet (5 mg total) by mouth daily.   Blood Pressure Kit Devi 1 Device by Does not apply route as needed.   coconut oil Oil Apply 1 application topically as needed (nipple pain).   ibuprofen 800 MG tablet Commonly known as: ADVIL Take 1 tablet (800 mg total) by mouth every 8 (eight) hours as needed for moderate pain.   oxyCODONE 5 MG immediate release tablet Commonly known as: Roxicodone Take 1 tablet (5 mg total) by mouth every 6 (six) hours as needed for severe pain or breakthrough pain.   polyethylene glycol 17 g packet Commonly known as: MIRALAX / GLYCOLAX Take 17 g by mouth daily as needed for mild constipation.   PrePLUS 27-1 MG Tabs Take 1 tablet by mouth daily.            Discharge Care Instructions  (From admission, onward)         Start     Ordered   09/08/19 0000  Leave dressing on - Keep it clean, dry, and intact until clinic visit        09/08/19 0621           Discharge home in stable condition Infant Feeding: Breast Infant Disposition:home with mother Discharge instruction: per After Visit Summary and Postpartum booklet. Activity: Advance as tolerated. Pelvic rest for 6 weeks.  Diet: routine diet Future Appointments: Future Appointments  Date Time Provider [place]  09/12/2019  1:30 PM [facility] NURSE [facility]  10/14/2019 10:35 AM Truett Mainland, DO [facility]   Follow up Visit:  Please schedule this patient for a In person postpartum visit in 6 weeks with the following provider: MD. Additional Postpartum F/U:Incision  check 1 week with BP check High risk pregnancy complicated by: repeat LTCS Delivery mode:  C-Section, Low Transverse  Anticipated Birth Control:  Unsure (considering IUD)  Ariz Terrones, Gildardo Cranker, MD OB Fellow, Faculty Practice 09/08/2019 6:41 AM

## 2019-09-05 NOTE — Lactation Note (Signed)
This note was copied from a baby's chart. Lactation Consultation Note  Patient Name: Sherri Clark WGNFA'O Date: 09/05/2019 Reason for consult: Initial assessment;Term;Other (Comment) (LGA)  Visited with mom of a 10 hours old FT female, she's a P2 but not very experienced BF. She exclusively pumped and bottle fed with her first baby, was born at [redacted] weeks gestation and was a NICU baby, she's now 29 y.o. She participated in the [facility] program at the [facility] but she wasn't very familiar with hand expression. When LC revised hand expression, colostrum was flowing easily out of mom's left breast. She has large breasts, large nipples and her tissue is very compressible.  Her RN has already set her up with a DEBP, instructions, cleaning and storage were reviewed, as well as milk storage guidelines. She had already pumped 10 ml, praised her for her efforts. Colostrum containers and spoons were provided, mom is planning on doing spoon feeding.   Baby already nursing when entering the room, noticed that she had him swaddled and latch wasn't very deep. LC offered to repositioned baby and mom agreed, baby latched on right away a few audible swallows noted on the 5 minutes feeding, baby started falling asleep and switched from nutritive sucking to comfort/non-nutritive sucking.   Mom wished not to wake baby up, she'll ask for assistance if she needs to, in order to do the spoon feeding. Reviewed normal newborn behavior, cluster feeding, feeding cues, pumping schedule and size of baby's stomach.  Feeding plan:  1. Encouraged mom to feed baby STS 8-12 times/24 hours or sooner if feeding cues are present 2. Hand expression and breast massage was also encouraged 3. Mom will continue pumping after feedings and will spoon feed any amount of EBM she may get, she'll start with 5 ml since baby is < 24 hours.  BF brochure, BF resources and feeding diary were reviewed. Dad present and supportive. Parents reported all  questions and concerns were answered, they're both aware of LC OP services and will call PRN.   Maternal Data Formula Feeding for Exclusion: No Has patient been taught Hand Expression?: Yes Does the patient have breastfeeding experience prior to this delivery?: Yes  Feeding Feeding Type: Breast Fed  LATCH Score Latch: Grasps breast easily, tongue down, lips flanged, rhythmical sucking. (but baby fell asleep shortly after)  Audible Swallowing: A few with stimulation  Type of Nipple: Everted at rest and after stimulation  Comfort (Breast/Nipple): Soft / non-tender  Hold (Positioning): Assistance needed to correctly position infant at breast and maintain latch.  LATCH Score: 8  Interventions Interventions: Breast feeding basics reviewed;Assisted with latch;Breast massage;Hand express;Breast compression;Support pillows;DEBP;Adjust position  Lactation Tools Discussed/Used Tools: Pump Breast pump type: Double-Electric Breast Pump WIC Program: Yes Pump Review: Setup, frequency, and cleaning;Milk Storage Initiated by:: RN and MPeck (breastmilk storage guidelines) Date initiated:: 09/05/19   Consult Status Consult Status: Follow-up Date: 09/06/19 Follow-up type: In-patient    Alexey Rhoads Venetia Constable 09/05/2019, 4:32 PM

## 2019-09-05 NOTE — Anesthesia Preprocedure Evaluation (Signed)
Anesthesia Evaluation  Patient identified by MRN, date of birth, ID band Patient awake    Reviewed: Allergy & Precautions, H&P , NPO status , Patient's Chart, lab work & pertinent test results  History of Anesthesia Complications Negative for: history of anesthetic complications  Airway Mallampati: II  TM Distance: >3 FB Neck ROM: full    Dental no notable dental hx.    Pulmonary neg pulmonary ROS, former smoker,    Pulmonary exam normal        Cardiovascular hypertension (PIH), Normal cardiovascular exam     Neuro/Psych negative neurological ROS  negative psych ROS   GI/Hepatic negative GI ROS, Neg liver ROS,   Endo/Other  negative endocrine ROS  Renal/GU negative Renal ROS  negative genitourinary   Musculoskeletal   Abdominal   Peds  Hematology negative hematology ROS (+)   Anesthesia Other Findings  1 prior C/S  Reproductive/Obstetrics (+) Pregnancy                             Anesthesia Physical Anesthesia Plan  ASA: II  Anesthesia Plan: Spinal   Post-op Pain Management:    Induction:   PONV Risk Score and Plan: Ondansetron and Treatment may vary due to age or medical condition  Airway Management Planned:   Additional Equipment:   Intra-op Plan:   Post-operative Plan:   Informed Consent: I have reviewed the patients History and Physical, chart, labs and discussed the procedure including the risks, benefits and alternatives for the proposed anesthesia with the patient or authorized representative who has indicated his/her understanding and acceptance.       Plan Discussed with:   Anesthesia Plan Comments:         Anesthesia Quick Evaluation

## 2019-09-05 NOTE — Progress Notes (Signed)
Sherri Clark is a 29 y.o. G2P0101 at [redacted]w[redacted]d.  Subjective:  Patient feeling better after having foley balloon fall out and being off pitocin. She says that she does not want to restart pitocin and would rather have a c section at this time. She reports that she is tired and starving, and that she felt like the 'past c section wasn't that bad anyways.'   Objective: BP 127/77   Pulse 94   Temp 98.7 F (37.1 C) (Oral)   Resp 18   Ht 5\' 7"  (1.702 m)   Wt 115.5 kg   LMP 11/28/2018 (Approximate)   BMI 39.88 kg/m    FHT:  FHR: 120 bpm, variability: mod, pos accels, decelerations:  none UC:   Q 3 minutes, mod Dilation: 3 Effacement (%): 50 Station: -3 Presentation: Vertex Exam by:: Dr. [email or web address]    Labs: NA  Assessment / Plan: 29 y.o. G2P0101 [redacted]w[redacted]d here for IOL for LGA in setting of TOLAC  #Induction ofLabor:Tolac candidate. SVE closed/long/high on admission. Pitocin started at 0109 on 8/22. Foley bulb out around ~0400. Pitocin washout for past two hours given prolonged pitocin use.  -at this time patient is declining to restart pitocin. Had long discussion with patient on risks/benefits of VBAC vs rLTCS. She wishes to proceed with rLTCS.   The risks of cesarean section were discussed with the patient including but were not limited to: bleeding which may require transfusion or reoperation; infection which may require antibiotics; injury to bowel, bladder, ureters or other surrounding organs; injury to the fetus; need for additional procedures including hysterectomy in the event of a life-threatening hemorrhage; placental abnormalities wth subsequent pregnancies, incisional problems, thromboembolic phenomenon and other postoperative/anesthesia complications. The patient concurred with the proposed plan, giving informed written consent for the procedures.  Patient has been NPO since midnight she will remain NPO for procedure. Anesthesia and OR aware.  Preoperative prophylactic  antibiotics and SCDs ordered on call to the OR. To OR when ready.   #FWB:Cat I #ID:GBS pos   9/22, MD 09/05/2019 5:30 AM

## 2019-09-05 NOTE — Op Note (Signed)
Sherri Clark PROCEDURE DATE: 09/05/2019  PREOPERATIVE DIAGNOSIS: Intrauterine pregnancy at  [redacted]w[redacted]d weeks gestation; patient declines vag del attempt  POSTOPERATIVE DIAGNOSIS: The same  PROCEDURE:     Cesarean Section  SURGEON:  Dr. Catalina Antigua  ASSISTANT: Dr. Casper Harrison  INDICATIONS: Sherri Clark is a 29 y.o. D3U2025 at [redacted]w[redacted]d scheduled for cesarean section secondary to patient declines vag del attempt.  The risks of cesarean section discussed with the patient included but were not limited to: bleeding which may require transfusion or reoperation; infection which may require antibiotics; injury to bowel, bladder, ureters or other surrounding organs; injury to the fetus; need for additional procedures including hysterectomy in the event of a life-threatening hemorrhage; placental abnormalities wth subsequent pregnancies, incisional problems, thromboembolic phenomenon and other postoperative/anesthesia complications. The patient concurred with the proposed plan, giving informed written consent for the procedure.    FINDINGS:  Viable female infant in cephalic presentation.  Apgars 9 and 9.  Clear amniotic fluid.  Intact placenta, three vessel cord.  Normal uterus, fallopian tubes and ovaries bilaterally.  ANESTHESIA:    Spinal INTRAVENOUS FLUIDS:1200 ml ESTIMATED BLOOD LOSS: 513 mL ml URINE OUTPUT:  150 ml SPECIMENS: Placenta sent to L&D COMPLICATIONS: None immediate  PROCEDURE IN DETAIL:  The patient received intravenous antibiotics and had sequential compression devices applied to her lower extremities while in the preoperative area.  She was then taken to the operating room where anesthesia was induced and was found to be adequate. A foley catheter was placed into her bladder and attached to Magnus Crescenzo gravity. She was then placed in a dorsal supine position with a leftward tilt, and prepped and draped in a sterile manner. After an adequate timeout was performed, a Pfannenstiel  skin incision was made with scalpel and carried through to the underlying layer of fascia. The fascia was incised in the midline and this incision was extended bilaterally using the Mayo scissors. Kocher clamps were applied to the superior aspect of the fascial incision and the underlying rectus muscles were dissected off bluntly. A similar process was carried out on the inferior aspect of the facial incision. The rectus muscles were separated in the midline bluntly and the peritoneum was entered bluntly. The Alexis self-retaining retractor was introduced into the abdominal cavity. Attention was turned to the lower uterine segment where a bladder flap was created, and a transverse hysterotomy was made with a scalpel and extended bilaterally bluntly. The infant was successfully delivered and delayed cord clamping was performed for 1 minute. The cord was clamped and cut and infant was handed over to awaiting neonatology team. Uterine massage was then administered and the placenta delivered intact with three-vessel cord. The uterus was cleared of clot and debris.  The hysterotomy was closed with 0 Vicryl in a running locked fashion, and an imbricating layer was also placed with a 0 Vicryl. Overall, excellent hemostasis was noted. The pelvis copiously irrigated and cleared of all clot and debris. Hemostasis was confirmed on all surfaces.  The peritoneum and the muscles were reapproximated using 0 vicryl interrupted stitches. The fascia was then closed using 0 Vicryl in a running fashion.  The skin was closed in a subcuticular fashion using 3.0 Vicryl. The patient tolerated the procedure well. Sponge, lap, instrument and needle counts were correct x 2. She was taken to the recovery room in stable condition.    Kasper Mudrick ConstantMD  09/05/2019 6:58 AM

## 2019-09-05 NOTE — Anesthesia Procedure Notes (Signed)
Spinal  Patient location during procedure: OR Staffing Performed: anesthesiologist  Anesthesiologist: Jona Zappone E, MD Preanesthetic Checklist Completed: patient identified, IV checked, risks and benefits discussed, surgical consent, monitors and equipment checked, pre-op evaluation and timeout performed Spinal Block Patient position: sitting Prep: DuraPrep and site prepped and draped Patient monitoring: continuous pulse ox, blood pressure and heart rate Approach: midline Location: L3-4 Injection technique: single-shot Needle Needle type: Pencan  Needle gauge: 24 G Needle length: 9 cm Additional Notes Functioning IV was confirmed and monitors were applied. Sterile prep and drape, including hand hygiene and sterile gloves were used. The patient was positioned and the spine was prepped. The skin was anesthetized with lidocaine.  Free flow of clear CSF was obtained prior to injecting local anesthetic into the CSF. The needle was carefully withdrawn. The patient tolerated the procedure well.      

## 2019-09-05 NOTE — Transfer of Care (Signed)
Immediate Anesthesia Transfer of Care Note  Patient: Sherri Clark  Procedure(s) Performed: CESAREAN SECTION (N/A )  Patient Location: PACU  Anesthesia Type:Spinal  Level of Consciousness: awake  Airway & Oxygen Therapy: Patient Spontanous Breathing  Post-op Assessment: Report given to RN  Post vital signs: Reviewed and stable  Last Vitals:  Vitals Value Taken Time  BP 105/48 09/05/19 0717  Temp 37.1 C 09/05/19 0717  Pulse 71 09/05/19 0722  Resp 14 09/05/19 0722  SpO2 97 % 09/05/19 0722  Vitals shown include unvalidated device data.  Last Pain:  Vitals:   09/05/19 0717  TempSrc:   PainSc: 0-No pain         Complications: No complications documented.

## 2019-09-06 LAB — CBC
HCT: 30.1 % — ABNORMAL LOW (ref 36.0–46.0)
Hemoglobin: 9.8 g/dL — ABNORMAL LOW (ref 12.0–15.0)
MCH: 31.5 pg (ref 26.0–34.0)
MCHC: 32.6 g/dL (ref 30.0–36.0)
MCV: 96.8 fL (ref 80.0–100.0)
Platelets: 194 10*3/uL (ref 150–400)
RBC: 3.11 MIL/uL — ABNORMAL LOW (ref 3.87–5.11)
RDW: 13.6 % (ref 11.5–15.5)
WBC: 9.7 10*3/uL (ref 4.0–10.5)
nRBC: 0 % (ref 0.0–0.2)

## 2019-09-06 LAB — CREATININE, SERUM
Creatinine, Ser: 0.76 mg/dL (ref 0.44–1.00)
GFR calc Af Amer: >60 mL/min (ref >60)
GFR calc non Af Amer: >60 mL/min (ref >60)

## 2019-09-06 LAB — BIRTH TISSUE RECOVERY COLLECTION (PLACENTA DONATION)

## 2019-09-06 MED ORDER — FERROUS SULFATE 325 (65 FE) MG PO TABS
325.0000 mg | ORAL_TABLET | Freq: Every day | ORAL | Status: DC
  Administered 2019-09-06 – 2019-09-08 (×3): 325 mg via ORAL
  Filled 2019-09-06 (×2): qty 1

## 2019-09-06 NOTE — Progress Notes (Addendum)
Post Partum Day 1  Subjective:  Sherri Clark is a 29 y.o. [id number] [redacted]w[redacted]d s/p rLTCS.  No acute events overnight.  Pt denies problems with ambulating, voiding or po intake.  She denies nausea or vomiting.  Pain is well controlled.  She has not had flatus. She has not had bowel movement.  Lochia Moderate.  Plan for birth control is IUD.  Method of Feeding: breast.  Objective: BP 125/73 (BP Location: Left Arm)   Pulse 94   Temp 98.6 F (37 C) (Oral)   Resp 16   Ht 5\' 7"  (1.702 m)   Wt 115.5 kg   LMP 11/28/2018 (Approximate)   SpO2 100%   Breastfeeding Unknown   BMI 39.88 kg/m   Physical Exam:  General: alert, cooperative and no distress Lochia:normal flow Chest: CTAB Heart: RRR no m/r/g Abdomen: +BS, soft, nontender, fundus firm at/below umbilicus Uterine Fundus: firm, above umbilicus DVT Evaluation: No evidence of DVT seen on physical exam. Extremities: no edema  Recent Labs    09/04/19 0029 09/06/19 0542  HGB 12.6 9.8*  HCT 37.1 30.1*    Assessment/Plan:  ASSESSMENT: Sherri Clark is a 29 y.o. G2P1102 [redacted]w[redacted]d ppd #1 s/p rLTCS doing well.   #Anemia: Hgb down to 9.8 this am. No lightheadedness or dizziness. PO iron for now. #MOC: interval IUD #MOF: Breast #circ: yes #dispo: 1 or 2 additional days of hospitalization anticipated prior to DC.    LOS: 2 days   [redacted]w[redacted]d 09/06/2019, 8:03 AM   Attestation of Supervision of Student:  I confirm that I have verified the information documented in the  resident's  note and that I have also personally reperformed the history, physical exam and all medical decision making activities.  I have verified that all services and findings are accurately documented in this student's note; and I agree with management and plan as outlined in the documentation. I have also made any necessary editorial changes.  09/08/2019, MD Center for [facility], [facility] 09/06/2019 9:05 AM

## 2019-09-06 NOTE — Lactation Note (Signed)
This note was copied from a baby's chart. Lactation Consultation Note  Patient Name: Sherri Clark YVOPF'Y Date: 09/06/2019 Reason for consult: Follow-up assessment;Mother's request  Baby Sherri Novick now 48 hours old.  Mom worried he is not getting enough and nipples are starting to get sore. Infant in crib on arrival. Cuing some.  Asked mom if I could show her hand expression and spoon feeding.  Mom agreed. Mom able to hand express easily.  Large drops and colostrum sprays.  Gave infant a spoon full and he started cuing.  Minimal assist with latch.  Mom reports comfort.  Discussed getting him in closer to mom and tummy to mommy.  Chin and cheeks touching breast and him in a straight line with ears, shoulders, hips.  Urged hand express and pat expressed mothers milk on nipples and air dry.  Minimal assist in football position on right breast.  Mom reports more comfort. Infant latched and breastfed well.  Infant had a slight increase in Bili.  Mom reports her first child was jaundice and had phototherphy.  Urged mom to do some hand expression or pumping with DEBP and spoon feed back small amounts to infant past breastfeeding. Answered all questions and concerns.  Left mom and baby breastfeeding.  Urged to call lactation as needed.     Caili Escalera S Dhana Totton 09/06/2019, 8:12 PM

## 2019-09-07 MED ORDER — AMLODIPINE BESYLATE 5 MG PO TABS
5.0000 mg | ORAL_TABLET | Freq: Every day | ORAL | Status: DC
  Administered 2019-09-07 – 2019-09-08 (×2): 5 mg via ORAL
  Filled 2019-09-07 (×2): qty 1

## 2019-09-07 NOTE — Progress Notes (Addendum)
Subjective: Postpartum Day 2: Cesarean Delivery Patient reports incisional pain, tolerating PO, + flatus, + BM. Notes mild central abdominal pain when voiding. Not passing any clots. Would like circumcision done today if possible.   Objective: Vital signs in last 24 hours: Temp:  [98.3 F (36.8 C)-98.6 F (37 C)] 98.4 F (36.9 C) (08/24 2010) Pulse Rate:  [71-94] 90 (08/24 2010) Resp:  [16-18] 18 (08/24 2010) BP: (118-132)/(64-80) 132/80 (08/24 2010) SpO2:  [99 %-100 %] 99 % (08/24 2010)  BPs 130s/70-80s  Physical Exam:  General: alert, cooperative, no distress and moderately obese Lochia: appropriate Uterine Fundus: firm Incision: healing well, no significant drainage, no significant erythema DVT Evaluation: 2+ pitting edema b/l. No tenderness to palpation  Recent Labs    09/06/19 0542  HGB 9.8*  HCT 30.1*    Assessment/Plan: Status post Cesarean section. Doing well postoperatively.  Continue current care. Contraception: IUD at post-partum visit outpatient.   Dispo: d/c home tomorrow   Sabino Dick 09/07/2019, 3:06 AM   I spoke with and examined patient and agree with resident/PA-S/MS/SNM's note and plan of care.  Cheral Marker, CNM, [place] 09/07/2019 7:20 AM   I spoke with and examined patient and agree with resident/PA-S/MS/SNM's note and plan of care. H/O GHTN/PPHTN, BPs 130s/70-80s, headache yesterday that resolved w/ ibuprofen. Start norvasc 5mg , reviewed pre-e s/s. Plan d/c tomorrow , CNM, [facility] 09/07/2019 7:22 AM

## 2019-09-07 NOTE — Progress Notes (Signed)
CSW spoke with MOB at bedside to address further needs/concerns. CSW introduced role and and advised MOB of the reason for CSW coming to speak with her. MOB reported "Is it really that serious? It is a touchy topic for me and I kept wondering why they kept asking me about that". CSW acknowledge this being a tough topic for MOB however CSW advised MOB that usually when staff is alerted that parents other children are not in the home with them, it tends to raise concerns. MOB reported understanding of this and then advised CSW "I was in foster care when I had her, so my social worker placed with her grandmother". CSW thanked MOB for information given. MOB expressed again "its a touchy topic and it wasn't like anything bad". CSW reported that she understood.   CSW reached out to [place] [employer] and was advised by Ada that she is not able to give CSW any information top CSW. CSW reached out to the Foster Care Department and left message at this time to try and get clarity on things. At this time CSW is awaiting call back from [place], however no barriers to infant discharging with MOB from CSW standpoint.     Sherri Clark, MSW, LCSW [facility] [place] [phone]    

## 2019-09-07 NOTE — Discharge Instructions (Signed)

## 2019-09-07 NOTE — Progress Notes (Signed)
CSW notified that MOB's oldest child lives with Paternal Grandmother, which raised concerns to staff. CSW asked to further assess situation. CSW went to speak with MOB at bedside, however MOB resting at this time. CSW to try back at a later time to further assess needs.   Sherri Clark, MSW, LCSW [facility] [place] [phone]  

## 2019-09-07 NOTE — Lactation Note (Signed)
This note was copied from a baby's chart. Lactation Consultation Note  Patient Name: Sherri Clark Date: 09/07/2019  [facility] attempt to see mom.  Mom in bathroom on phone.  Father of baby holding baby.  Dad says it will be a while that mom is venting.  Spoke with dad briefly about breastfeeding.  He reports he has no questions/concerns.  Praised breastfeeding.  Urged dad to have mom reach out to lactation when ready to be seen. Let RN know.    Maternal Data    Feeding Feeding Type: Breast Fed Nipple Type: Slow - flow  LATCH Score                   Interventions    Lactation Tools Discussed/Used     Consult Status      Courtenay Hirth Michaelle Copas 09/07/2019, 2:46 PM

## 2019-09-07 NOTE — Progress Notes (Signed)
CSW spoke with Intake Worker at [place] [employer] and was advised that MOB has no open cases at this time.    Lilianah S. Amulya Quintin, MSW, LCSW [facility] [place] [phone]   

## 2019-09-08 MED ORDER — OXYCODONE HCL 5 MG PO TABS
5.0000 mg | ORAL_TABLET | Freq: Four times a day (QID) | ORAL | 0 refills | Status: DC | PRN
Start: 2019-09-08 — End: 2020-04-05

## 2019-09-08 MED ORDER — COCONUT OIL OIL: 1.0000 "application " | TOPICAL_OIL | 0 refills | Status: DC | PRN

## 2019-09-08 MED ORDER — OXYCODONE HCL 5 MG PO TABS: 5.0000 mg | ORAL_TABLET | Freq: Four times a day (QID) | ORAL | 0 refills | Status: DC | PRN

## 2019-09-08 MED ORDER — POLYETHYLENE GLYCOL 3350 17 G PO PACK
17.0000 g | PACK | Freq: Every day | ORAL | Status: DC | PRN
  Administered 2019-09-08: 17 g via ORAL
  Filled 2019-09-08: qty 1

## 2019-09-08 MED ORDER — AMLODIPINE BESYLATE 5 MG PO TABS
5.0000 mg | ORAL_TABLET | Freq: Every day | ORAL | 1 refills | Status: DC
Start: 2019-09-08 — End: 2020-04-05

## 2019-09-08 MED ORDER — IBUPROFEN 800 MG PO TABS: 800.0000 mg | ORAL_TABLET | Freq: Three times a day (TID) | ORAL | 0 refills | Status: DC | PRN

## 2019-09-08 MED ORDER — POLYETHYLENE GLYCOL 3350 17 G PO PACK: 17.0000 g | PACK | Freq: Every day | ORAL | 0 refills | Status: DC | PRN

## 2019-09-08 MED FILL — IBUPROFEN 800 MG TAB: 800 | 10 days supply | Qty: 30 | Fill #0

## 2019-09-08 MED FILL — AMLODIPINE BESYLATE 5 MG TA: 5 | 30 days supply | Qty: 30 | Fill #0

## 2019-09-08 MED FILL — oxyCODONE HCL 5 MG TABS: 5 | 5 days supply | Qty: 20 | Fill #0

## 2019-09-08 MED FILL — POLYETHYLENE GLYCOL 3350 PO: 17 | 14 days supply | Qty: 238 | Fill #0

## 2019-09-08 NOTE — Lactation Note (Signed)
This note was copied from a baby's chart. Lactation Consultation Note  Patient Name: Sherri Clark Date: 09/08/2019   Per RN note mother declined lactation.     Maternal Data    Feeding Feeding Type: Breast Milk  LATCH Score                   Interventions    Lactation Tools Discussed/Used     Consult Status      Hardie Pulley 09/08/2019, 11:29 AM

## 2019-09-09 DIAGNOSIS — S40021A Contusion of right upper arm, initial encounter: Secondary | ICD-10-CM | POA: Diagnosis not present

## 2019-09-12 ENCOUNTER — Ambulatory Visit: Payer: Self-pay

## 2019-09-12 ENCOUNTER — Telehealth: Payer: Self-pay | Admitting: *Deleted

## 2019-09-12 NOTE — Telephone Encounter (Signed)
Transition Care Management Unsuccessful Follow-up Telephone Call  Date of discharge and from where:  [facility], 09/08/19  Attempts:  1st Attempt  Reason for unsuccessful TCM follow-up call:  Left voice message.  Burnard Bunting, RN, BSN, CCRN [facility] [phone]

## 2019-09-16 ENCOUNTER — Telehealth: Payer: Self-pay | Admitting: *Deleted

## 2019-09-16 ENCOUNTER — Encounter: Payer: Self-pay | Admitting: General Practice

## 2019-09-16 NOTE — Telephone Encounter (Signed)
Transition Care Management Unsuccessful Follow-up Telephone Call  Date of discharge and from where:  09/08/19, [facility]  Attempts:  2nd Attempt  Reason for unsuccessful TCM follow-up call:  Left voice message.  Burnard Bunting, RN, BSN, CCRN Patient Engagement Center [phone]

## 2019-10-14 ENCOUNTER — Ambulatory Visit: Payer: Self-pay | Admitting: Family Medicine

## 2019-11-21 ENCOUNTER — Encounter: Payer: Self-pay | Admitting: *Deleted

## 2019-12-13 ENCOUNTER — Ambulatory Visit: Payer: Medicaid Other | Admitting: Obstetrics and Gynecology

## 2020-04-05 ENCOUNTER — Other Ambulatory Visit: Payer: Self-pay

## 2020-04-05 ENCOUNTER — Other Ambulatory Visit (HOSPITAL_COMMUNITY)
Admission: RE | Admit: 2020-04-05 | Discharge: 2020-04-05 | Disposition: A | Discharge status: Home / Self Care | Payer: Medicaid Other | Source: Ambulatory Visit | Attending: Family Medicine | Admitting: Family Medicine

## 2020-04-05 ENCOUNTER — Ambulatory Visit (INDEPENDENT_AMBULATORY_CARE_PROVIDER_SITE_OTHER): Payer: Medicaid Other

## 2020-04-05 VITALS — BP 119/81 | HR 87 | Wt 211.1 lb

## 2020-04-05 DIAGNOSIS — Z113 Encounter for screening for infections with a predominantly sexual mode of transmission: Secondary | ICD-10-CM | POA: Insufficient documentation

## 2020-04-05 NOTE — Progress Notes (Signed)
Pt here today for STD testing. Denies vaginal bleeding, discharge, odor or itching. Pt states has new partner and would like to be checked.   Self swab collected today. Pt advised results will take 24-48 hours and will see results in mychart and will be notified if needs further treatment. Pt verbalized understanding.   Judeth Cornfield, RN  04/05/20.

## 2020-04-05 NOTE — Progress Notes (Signed)
Patient was assessed and managed by nursing staff during this encounter. I have reviewed the chart and agree with the documentation and plan. I have also made any necessary editorial changes.  Kiyoto Slomski L Ellena Kamen, CNM 04/05/2020 4:53 PM   

## 2020-04-09 ENCOUNTER — Telehealth: Payer: Self-pay | Admitting: Family Medicine

## 2020-04-09 LAB — CERVICOVAGINAL ANCILLARY ONLY
Bacterial Vaginitis (gardnerella): POSITIVE — AB
Candida Glabrata: NEGATIVE
Candida Vaginitis: NEGATIVE
Chlamydia: NEGATIVE
Comment: NEGATIVE
Comment: NEGATIVE
Comment: NEGATIVE
Comment: NEGATIVE
Comment: NEGATIVE
Comment: NORMAL
Neisseria Gonorrhea: POSITIVE — AB
Trichomonas: NEGATIVE

## 2020-04-09 MED ORDER — FLUCONAZOLE 150 MG PO TABS: 150.0000 mg | ORAL_TABLET | Freq: Once | ORAL | 0 refills | Status: AC

## 2020-04-09 MED ORDER — METRONIDAZOLE 500 MG PO TABS: 500.0000 mg | ORAL_TABLET | Freq: Two times a day (BID) | ORAL | 0 refills | Status: DC

## 2020-04-09 NOTE — Telephone Encounter (Signed)
Pt states she did self swab last week and saw today that Mychart states positive for STD and she has not recvd a call and needs a call back to let her know what she needs to do. thanks

## 2020-04-09 NOTE — Telephone Encounter (Signed)
Spoke with pt. Pt states seeing STD results on mychart. Pt is positive for gonorrhea  and BV. Pt states usually has yeast infections following abx treatment. Pt advised will send Flagyl Rx and Diflucan Rx to pharmacy today. Pt advised to start taking today. Advised no intercourse until 1 week and after last partner treated. Pt verbalized understanding.   Pt scheduled for Gonorrhea treatment as nurse visit on 3/29 at 130 pm. Pt agreeable and verbalized understanding of date and time of appt.   Routing to Dr Vergie Living for review.  Encounter closed  [place], RN 04/09/20.

## 2020-04-10 ENCOUNTER — Ambulatory Visit (INDEPENDENT_AMBULATORY_CARE_PROVIDER_SITE_OTHER): Payer: Medicaid Other

## 2020-04-10 ENCOUNTER — Other Ambulatory Visit: Payer: Self-pay

## 2020-04-10 VITALS — BP 120/75 | HR 79 | Ht 66.0 in | Wt 210.4 lb

## 2020-04-10 DIAGNOSIS — A549 Gonococcal infection, unspecified: Secondary | ICD-10-CM | POA: Diagnosis not present

## 2020-04-10 MED ORDER — CEFTRIAXONE SODIUM 500 MG IJ SOLR
500.0000 mg | Freq: Once | INTRAMUSCULAR | Status: AC
Start: 2020-04-10 — End: 2020-04-10
  Administered 2020-04-10: 500 mg via INTRAMUSCULAR

## 2020-04-10 NOTE — Progress Notes (Signed)
Pt here today for gonorrhea treatment from + results on 04/05/20.  Pt given injection in right OUQ. Pt tolerated well. VSS.  Pt advised no intercourse after 1 week of all treated partners. Pt states is no longer with partner. Pt advised to take all Rx Flagyl for +BV as well. Pt agreeable and verbalized understanding.   Pt has TOC nurse visit scheduled for 4/25 at 10 am. Pt agreeable to date and time of appt.   Judeth Cornfield, RN  04/10/20

## 2020-04-10 NOTE — Progress Notes (Signed)
I have reviewed and agree with the care plan as documented in the RN visit note.   Casper Harrison, MD [facility] Fellow, [facility] for [facility], [facility]

## 2020-05-07 ENCOUNTER — Ambulatory Visit: Payer: Medicaid Other

## 2020-05-13 DIAGNOSIS — Z3200 Encounter for pregnancy test, result unknown: Secondary | ICD-10-CM | POA: Diagnosis not present

## 2020-05-13 DIAGNOSIS — R070 Pain in throat: Secondary | ICD-10-CM | POA: Diagnosis not present

## 2020-05-14 ENCOUNTER — Ambulatory Visit: Payer: Medicaid Other | Admitting: Obstetrics and Gynecology

## 2020-06-30 DIAGNOSIS — N898 Other specified noninflammatory disorders of vagina: Secondary | ICD-10-CM | POA: Diagnosis not present

## 2020-06-30 DIAGNOSIS — B373 Candidiasis of vulva and vagina: Secondary | ICD-10-CM | POA: Diagnosis not present

## 2020-07-25 ENCOUNTER — Other Ambulatory Visit: Payer: Self-pay

## 2020-07-25 ENCOUNTER — Encounter (HOSPITAL_COMMUNITY): Payer: Self-pay

## 2020-07-25 ENCOUNTER — Emergency Department (HOSPITAL_COMMUNITY)
Admission: EM | Admit: 2020-07-25 | Discharge: 2020-07-25 | Disposition: A | Discharge status: Home / Self Care | Payer: Medicaid Other | Attending: Emergency Medicine | Admitting: Emergency Medicine

## 2020-07-25 DIAGNOSIS — J069 Acute upper respiratory infection, unspecified: Secondary | ICD-10-CM | POA: Diagnosis not present

## 2020-07-25 DIAGNOSIS — Z87891 Personal history of nicotine dependence: Secondary | ICD-10-CM | POA: Diagnosis not present

## 2020-07-25 DIAGNOSIS — R059 Cough, unspecified: Secondary | ICD-10-CM

## 2020-07-25 DIAGNOSIS — R110 Nausea: Secondary | ICD-10-CM

## 2020-07-25 DIAGNOSIS — U071 COVID-19: Secondary | ICD-10-CM | POA: Diagnosis not present

## 2020-07-25 MED ORDER — ONDANSETRON HCL 4 MG PO TABS: 4.0000 mg | ORAL_TABLET | Freq: Three times a day (TID) | ORAL | 0 refills | Status: AC | PRN

## 2020-07-25 MED ORDER — NIRMATRELVIR/RITONAVIR (PAXLOVID)TABLET: 3.0000 | ORAL_TABLET | Freq: Two times a day (BID) | ORAL | 0 refills | Status: AC

## 2020-07-25 NOTE — ED Provider Notes (Signed)
[facility]-EMERGENCY DEPT Provider Note   CSN: [id number] Arrival date & time: 07/25/20  7846     History Chief Complaint  Patient presents with   Weakness    Sherri Clark is a 30 y.o. female.  The history is provided by the patient and medical records. No language interpreter was used.  URI Presenting symptoms: congestion, cough, fatigue, fever and rhinorrhea   Severity:  Moderate Onset quality:  Gradual Duration:  4 days Timing:  Constant Progression:  Waxing and waning Chronicity:  New Relieved by:  Nothing Worsened by:  Nothing Ineffective treatments:  None tried Associated symptoms: headaches (mild) and myalgias   Associated symptoms: no neck pain   Risk factors: sick contacts (another fam member uri)       Past Medical History:  Diagnosis Date   Gonorrhea 2013   Pregnancy induced hypertension     Patient Active Problem List   Diagnosis Date Noted   Term pregnancy 09/04/2019   LGA (large for gestational age) fetus affecting management of mother 08/31/2019   Positive GBS test 08/20/2019   History of gestational hypertension 03/29/2019   History of cesarean delivery 02/28/2019   BMI 29.0-29.9,adult 02/28/2019   Supervision of high risk pregnancy, antepartum 01/31/2019   History of gonorrhea 01/31/2019    Past Surgical History:  Procedure Laterality Date   CESAREAN SECTION     CESAREAN SECTION N/A 09/05/2019   Procedure: CESAREAN SECTION;  Surgeon: Catalina Antigua, MD;  Location: [facility] LD ORS;  Service: Obstetrics;  Laterality: N/A;  Elective Repeat   CHOLECYSTECTOMY     EYE SURGERY       OB History     Gravida  2   Para  2   Term  1   Preterm  1   AB      Living  2      SAB      IAB      Ectopic      Multiple  0   Live Births  2           Family History  Problem Relation Age of Onset   Lupus Mother    Hypertension Father     Social History   Tobacco Use   Smoking status: Former    Pack  years: 0.00    Types: Cigarettes    Quit date: 01/30/2017    Years since quitting: 3.4   Smokeless tobacco: Never  Vaping Use   Vaping Use: Never used  Substance Use Topics   Alcohol use: Never   Drug use: Never    Home Medications Prior to Admission medications   Medication Sig Start Date End Date Taking? Authorizing Provider  metroNIDAZOLE (FLAGYL) 500 MG tablet Take 1 tablet (500 mg total) by mouth 2 (two) times daily. 04/09/20   [place] Bing, MD    Allergies    Patient has no known allergies.  Review of Systems   Review of Systems  Constitutional:  Positive for chills, fatigue and fever.  HENT:  Positive for congestion and rhinorrhea.   Respiratory:  Positive for cough. Negative for chest tightness.   Cardiovascular:  Negative for chest pain.  Gastrointestinal:  Positive for nausea. Negative for abdominal pain, constipation, diarrhea and vomiting.  Genitourinary:  Negative for dysuria, flank pain and frequency.  Musculoskeletal:  Positive for myalgias. Negative for back pain, neck pain and neck stiffness.  Skin:  Negative for rash.  Neurological:  Positive for headaches (mild). Negative for weakness, light-headedness  and numbness.  Psychiatric/Behavioral:  Negative for agitation and confusion.    Physical Exam Updated Vital Signs BP 130/88   Pulse 99   Temp 100 F (37.8 C) (Oral)   Resp 20   Ht 5\' 6"  (1.676 m)   Wt 95 kg   SpO2 100%   BMI 33.80 kg/m   Physical Exam Vitals and nursing note reviewed.  Constitutional:      General: She is not in acute distress.    Appearance: She is well-developed. She is not ill-appearing, toxic-appearing or diaphoretic.  HENT:     Head: Normocephalic and atraumatic.     Nose: Congestion present. No rhinorrhea.     Mouth/Throat:     Mouth: Mucous membranes are moist.     Pharynx: No oropharyngeal exudate or posterior oropharyngeal erythema.  Eyes:     Extraocular Movements: Extraocular movements intact.      Conjunctiva/sclera: Conjunctivae normal.     Pupils: Pupils are equal, round, and reactive to light.  Cardiovascular:     Rate and Rhythm: Normal rate and regular rhythm.     Heart sounds: No murmur heard. Pulmonary:     Effort: Pulmonary effort is normal. No respiratory distress.     Breath sounds: Normal breath sounds. No wheezing, rhonchi or rales.  Chest:     Chest wall: No tenderness.  Abdominal:     General: Abdomen is flat.     Palpations: Abdomen is soft.     Tenderness: There is no abdominal tenderness. There is no right CVA tenderness, left CVA tenderness, guarding or rebound.  Musculoskeletal:        General: Tenderness (muscle tenderness diffusely) present.     Cervical back: Neck supple. No tenderness.  Skin:    General: Skin is warm and dry.     Capillary Refill: Capillary refill takes less than 2 seconds.     Findings: No erythema or rash.  Neurological:     General: No focal deficit present.     Mental Status: She is alert.     Sensory: No sensory deficit.     Motor: No weakness.  Psychiatric:        Mood and Affect: Mood normal.    ED Results / Procedures / Treatments   Labs (all labs ordered are listed, but only abnormal results are displayed) Labs Reviewed - No data to display  EKG None  Radiology No results found.  Procedures Procedures   Medications Ordered in ED Medications - No data to display  ED Course  I have reviewed the triage vital signs and the nursing notes.  Pertinent labs & imaging results that were available during my care of the patient were reviewed by me and considered in my medical decision making (see chart for details).    MDM Rules/Calculators/A&P                          Sherri Clark is a 30 y.o. female with a past medical history significant for elevated BMI, previous gestational hypertension, and prior cholecystectomy who presents with a positive home COVID test yesterday as well as continued URI symptoms with  rhinorrhea, gesturing, cough, legs, mild headache, fatigue, and nausea.  Patient reports has had URI symptoms for the last 4 days and Member who had symptoms before her.  She reports fevers waxing waning and has been try to take medication for it.  She reports has had nausea but no vomiting.  She reports  productive cough but denies any actual chest pain or shortness of breath.  She reports no constipation or diarrhea and denies any urinary changes.  She wanted to make sure she had nothing else going on.  On exam, lungs are clear and chest is nontender.  Abdomen is nontender.  No focal neurologic deficits.  Patient has reassuring vital signs on arrival without significant tachycardia, tachypnea, hypoxia, or fever here.  Temperature was around 100.  Had a shared decision made conversation with patient about management.  Given her vital signs without hypoxia, reassuring exam, I do suspect is COVID-19 given the test positive yesterday and given her body habitus and weight, she does qualify for Paxlovid as an outpatient to prevent acute worsening.  We will also give prescription for nausea medication to help her maintain hydration at home.  We agreed together to hold on further extensive work-up here and patient understands PCP follow-up instructions and return precautions.  As she tested positive yesterday, we agreed to not retest her again today.  She had no questions or concerns and was discharged to condition for home treatment of her likely COVID-19 infection.    Final Clinical Impression(s) / ED Diagnoses Final diagnoses:  COVID-19  Upper respiratory tract infection, unspecified type  Cough  Nausea    Rx / DC Orders ED Discharge Orders          Ordered    nirmatrelvir/ritonavir EUA (PAXLOVID) TABS  2 times daily        07/25/20 1234    ondansetron (ZOFRAN) 4 MG tablet  Every 8 hours PRN        07/25/20 1234           Clinical Impression: 1. COVID-19   2. Upper respiratory tract  infection, unspecified type   3. Cough   4. Nausea     Disposition: Discharge  Condition: Good  I have discussed the results, Dx and Tx plan with the pt(& family if present). He/she/they expressed understanding and agree(s) with the plan. Discharge instructions discussed at great length. Strict return precautions discussed and pt &/or family have verbalized understanding of the instructions. No further questions at time of discharge.    New Prescriptions   NIRMATRELVIR/RITONAVIR EUA (PAXLOVID) TABS    Take 3 tablets by mouth 2 (two) times daily for 5 days. Patient GFR is normal last time assessed. Take nirmatrelvir (150 mg) two tablets twice daily for 5 days and ritonavir (100 mg) one tablet twice daily for 5 days.   ONDANSETRON (ZOFRAN) 4 MG TABLET    Take 1 tablet (4 mg total) by mouth every 8 (eight) hours as needed for nausea or vomiting.    Follow Up: Constant, Gigi Gin, MD [address] [place] [address] [phone]     [facility] [address] [id number] mc [address] [place] [address] [phone]       Addelyn Alleman, Canary Brim, MD 07/25/20 1250

## 2020-07-25 NOTE — ED Triage Notes (Signed)
Pt reports max temp at home 100.1, headache, weakness, productive cough, Nausea x 2days.

## 2020-07-25 NOTE — ED Notes (Signed)
Patient pulled code button in room, went into room and patient is on facetime and says "how long is this going to take yall don't look that busy." I explained to nurse that I cant tell her how long it will take. Patient proceeded to say "well yall donut understand and may not have anything to do but if its going to take an hour I am just going go home" asked patient if there was anything I could do to make her more comfortable and she said a cold wash cloth. Cold wash cloth provided.

## 2020-07-25 NOTE — Discharge Instructions (Addendum)
Your history, exam, and recent home COVID test being positive fit with COVID-19 infection causing your cluster of symptoms.  Your vital signs here were reassuring and her oxygen saturations were low.  We feel you are safe for discharge home and patient and her exam we agreed together that you did not need more extensive work-up here you do however qualify for outpatient COVID-19 medication treatment as well as the prescription for nausea medicine to help maintain your hydration.  Please follow-up with your PCP and stay isolated.  If any symptoms change or worsen, please return to the nearest emergency department.

## 2020-07-25 NOTE — ED Notes (Signed)
Left before discharge process could be completed.

## 2020-07-26 ENCOUNTER — Telehealth: Payer: Self-pay

## 2020-07-26 NOTE — Telephone Encounter (Addendum)
Transition Care Management Follow-up Telephone Call Date of discharge and from where: 07/25/2020-[facility] How have you been since you were released from the hospital? Doing better but still have some eye soreness with a little weakness.  Any questions or concerns? No  Items Reviewed: Did the pt receive and understand the discharge instructions provided? Yes  Medications obtained and verified? Yes  Other? No  Any new allergies since your discharge? No  Dietary orders reviewed? N/A Do you have support at home? Yes   Home Care and Equipment/Supplies: Were home health services ordered? not applicable If so, what is the name of the agency? N/A  Has the agency set up a time to come to the patient's home? not applicable Were any new equipment or medical supplies ordered?  No What is the name of the medical supply agency? N/A Were you able to get the supplies/equipment? not applicable Do you have any questions related to the use of the equipment or supplies? No  Functional Questionnaire: (I = Independent and D = Dependent) ADLs: I  Bathing/Dressing- I  Meal Prep- I  Eating- I  Maintaining continence- I  Transferring/Ambulation- I  Managing Meds- I  Follow up appointments reviewed:  PCP Hospital f/u appt confirmed? No  Will call to schedule appointment soon . Specialist Hospital f/u appt confirmed? No   Are transportation arrangements needed? No  If their condition worsens, is the pt aware to call PCP or go to the Emergency Dept.? Yes Was the patient provided with contact information for the PCP's office or ED? Yes Was to pt encouraged to call back with questions or concerns? Yes

## 2020-09-25 DIAGNOSIS — Z113 Encounter for screening for infections with a predominantly sexual mode of transmission: Secondary | ICD-10-CM | POA: Diagnosis not present

## 2020-09-27 NOTE — Progress Notes (Deleted)
   GYNECOLOGY OFFICE VISIT NOTE  History:   Sherri Clark is a 30 y.o. [id number] here today for birth control.     She denies any abnormal vaginal discharge, bleeding, pelvic pain or other concerns.     Past Medical History:  Diagnosis Date   Gonorrhea 2013   Pregnancy induced hypertension     Past Surgical History:  Procedure Laterality Date   CESAREAN SECTION     CESAREAN SECTION N/A 09/05/2019   Procedure: CESAREAN SECTION;  Surgeon: Catalina Antigua, MD;  Location: [facility];  Service: Obstetrics;  Laterality: N/A;  Elective Repeat   CHOLECYSTECTOMY     EYE SURGERY      The following portions of the patient's history were reviewed and updated as appropriate: allergies, current medications, past family history, past medical history, past social history, past surgical history and problem list.   Health Maintenance:  Normal pap on 02/2019  Review of Systems:  Pertinent items noted in HPI and remainder of comprehensive ROS otherwise negative.  Physical Exam:  There were no vitals taken for this visit. CONSTITUTIONAL: Well-developed, well-nourished female in no acute distress.  HEENT:  Normocephalic, atraumatic. External right and left ear normal. No scleral icterus.  NECK: Normal range of motion, supple, no masses noted on observation SKIN: No rash noted. Not diaphoretic. No erythema. No pallor. MUSCULOSKELETAL: Normal range of motion. No edema noted. NEUROLOGIC: Alert and oriented to person, place, and time. Normal muscle tone coordination. No cranial nerve deficit noted. PSYCHIATRIC: Normal mood and affect. Normal behavior. Normal judgment and thought content.  CARDIOVASCULAR: Normal heart rate noted RESPIRATORY: Effort and breath sounds normal, no problems with respiration noted ABDOMEN: No masses noted. No other overt distention noted.    PELVIC: {Blank single:19197::"Deferred","Normal appearing external genitalia; normal urethral meatus; normal appearing vaginal  mucosa and cervix.  No abnormal discharge noted.  Normal uterine size, no other palpable masses, no uterine or adnexal tenderness. Performed in the presence of a chaperone"}  Labs and Imaging No results found for this or any previous visit (from the past 168 hour(s)). No results found.    Assessment and Plan:    There are no diagnoses linked to this encounter.  Routine preventative health maintenance measures emphasized. Please refer to After Visit Summary for other counseling recommendations.   No follow-ups on file.    I spent {Blank single:19197::"10","15","20","25","30"} minutes dedicated to the care of this patient including pre-visit review of records, face to face time with the patient discussing her conditions and treatments and post visit orders.    Milas Hock, MD, FACOG Obstetrician & Gynecologist, [facility] for [facility], [facility]

## 2020-09-28 ENCOUNTER — Encounter: Payer: Medicaid Other | Admitting: Obstetrics and Gynecology

## 2020-11-07 DIAGNOSIS — Z202 Contact with and (suspected) exposure to infections with a predominantly sexual mode of transmission: Secondary | ICD-10-CM | POA: Diagnosis not present

## 2020-11-07 DIAGNOSIS — N760 Acute vaginitis: Secondary | ICD-10-CM | POA: Diagnosis not present

## 2020-11-07 DIAGNOSIS — B3741 Candidal cystitis and urethritis: Secondary | ICD-10-CM | POA: Diagnosis not present

## 2020-11-07 DIAGNOSIS — R300 Dysuria: Secondary | ICD-10-CM | POA: Diagnosis not present

## 2020-12-24 DIAGNOSIS — Z113 Encounter for screening for infections with a predominantly sexual mode of transmission: Secondary | ICD-10-CM | POA: Diagnosis not present

## 2020-12-24 DIAGNOSIS — Z3042 Encounter for surveillance of injectable contraceptive: Secondary | ICD-10-CM | POA: Diagnosis not present

## 2021-05-27 DIAGNOSIS — Z3042 Encounter for surveillance of injectable contraceptive: Secondary | ICD-10-CM | POA: Diagnosis not present

## 2021-05-27 DIAGNOSIS — Z114 Encounter for screening for human immunodeficiency virus [HIV]: Secondary | ICD-10-CM | POA: Diagnosis not present

## 2021-05-27 DIAGNOSIS — Z113 Encounter for screening for infections with a predominantly sexual mode of transmission: Secondary | ICD-10-CM | POA: Diagnosis not present

## 2021-05-27 DIAGNOSIS — Z3202 Encounter for pregnancy test, result negative: Secondary | ICD-10-CM | POA: Diagnosis not present

## 2021-05-30 ENCOUNTER — Ambulatory Visit: Payer: Medicaid Other | Admitting: Nurse Practitioner

## 2021-07-12 DIAGNOSIS — N898 Other specified noninflammatory disorders of vagina: Secondary | ICD-10-CM | POA: Diagnosis not present

## 2021-08-20 DIAGNOSIS — Z3042 Encounter for surveillance of injectable contraceptive: Secondary | ICD-10-CM | POA: Diagnosis not present

## 2021-09-19 IMAGING — MR MR ABDOMEN W/O CM
16 of 18 series · 40 of 48 positions shown · non-contrast
Comparison: None.

CLINICAL DATA: Right lower quadrant pain during pregnancy

EXAM:
MRI ABDOMEN AND PELVIS WITHOUT CONTRAST
TECHNIQUE: Multiplanar multisequence MR imaging of the abdomen and pelvis was
performed. No intravenous contrast was administered.

[Series 4: cor haste · coronal · 6.0mm · 1.19mm/px · 3 of 30 slices shown]
[im 1/30]
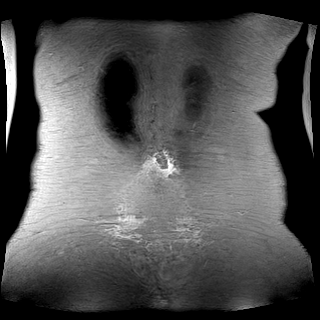
[im 15/30]
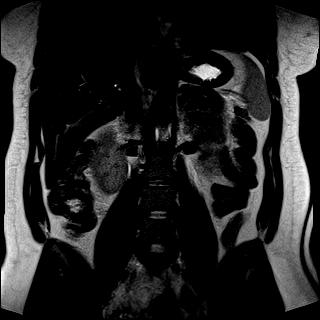
[im 30/30]
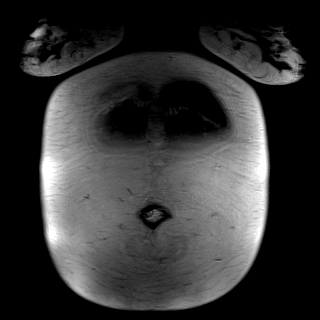

[Series 5: cor haste fs · coronal · 6.0mm · 1.25mm/px · 2 of 30 slices shown]
[im 1/30]
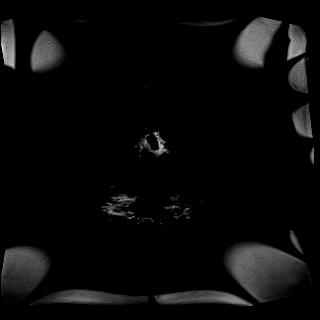
[im 30/30]
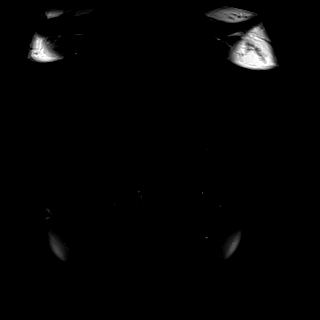

[Series 6: bSSFP · coronal · 6.0mm · 0.78mm/px · 2 of 30 slices shown (1 of 4)]
[im 1/30]
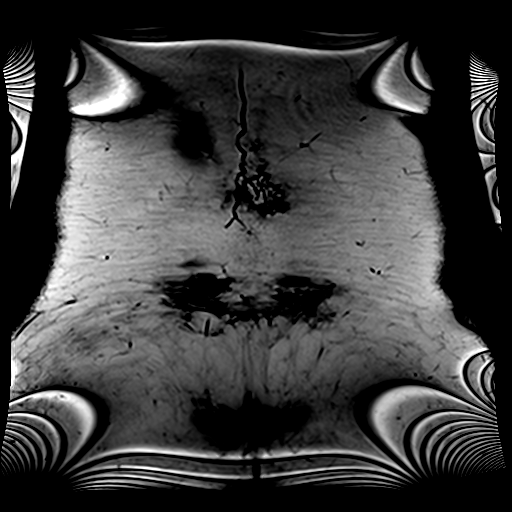
[im 30/30]
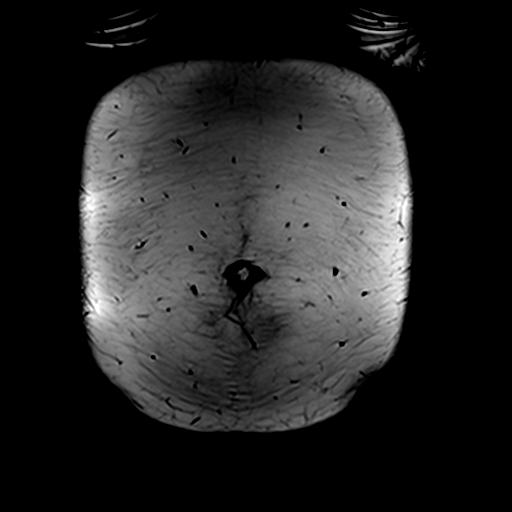

[Series 7: ax haste · axial · 5.0mm · 1.19mm/px · z∈[-168,+30]mm · 2 of 34 slices shown (1 of 2)]
[im 1/34]
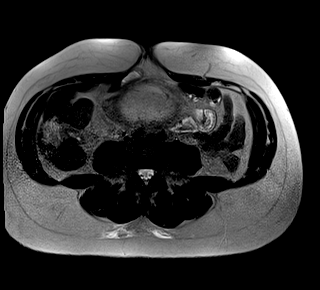
[im 34/34]
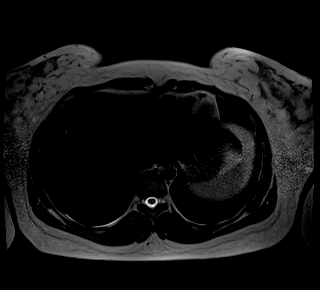

[Series 8: ax haste · axial · 5.0mm · 1.19mm/px · z∈[-373,-175]mm · 2 of 34 slices shown (2 of 2)]
[im 1/34]
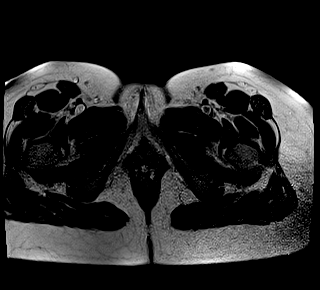
[im 34/34]
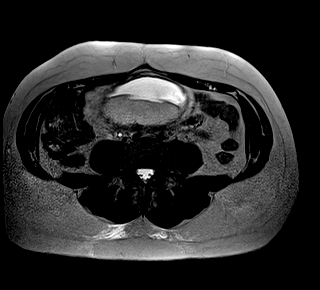

[Series 9: ax haste_comp · axial · 5.0mm · 1.19mm/px · z∈[-373,+30]mm · 4 of 68 slices shown]
[im 1/68]
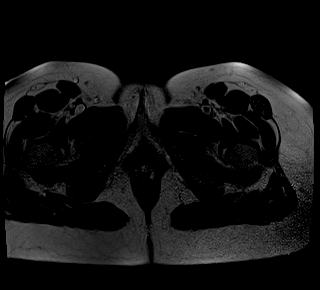
[im 23/68]
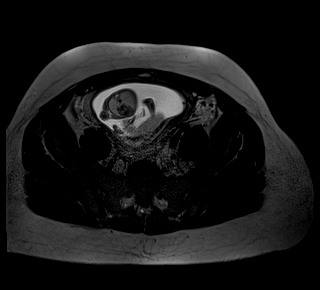
[im 45/68]
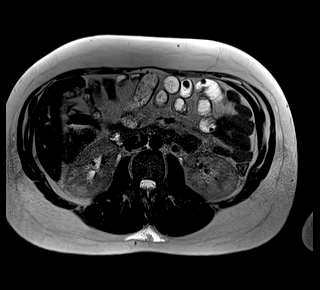
[im 68/68]
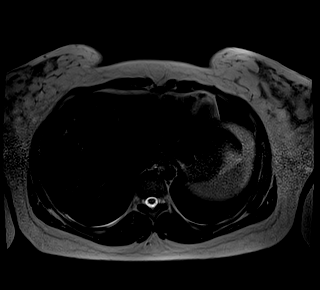

[Series 12: T2 fat-sat · axial · 5.0mm · 1.19mm/px · z∈[-141,+93]mm · 2 of 40 slices shown (1 of 3)]
[im 1/40]
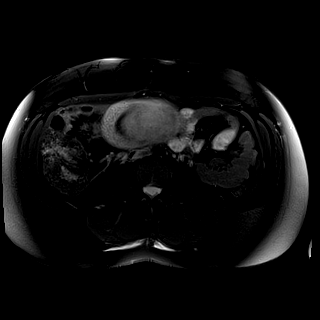
[im 40/40]
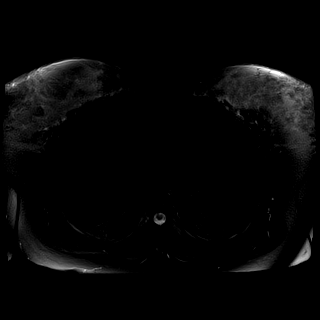

[Series 13: T2 fat-sat · axial · 5.0mm · 1.19mm/px · z∈[-378,-144]mm · 2 of 40 slices shown (2 of 3)]
[im 1/40]
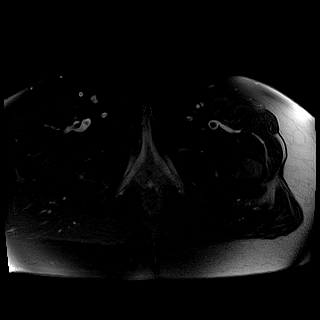
[im 40/40]
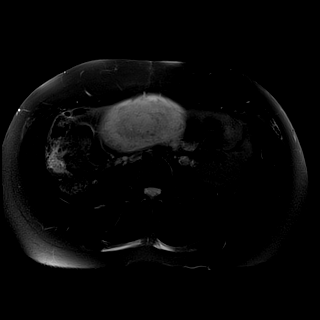

[Series 14: T2 fat-sat · axial · 5.0mm · 1.19mm/px · z∈[-378,+93]mm · 4 of 80 slices shown (3 of 3)]
[im 1/80]
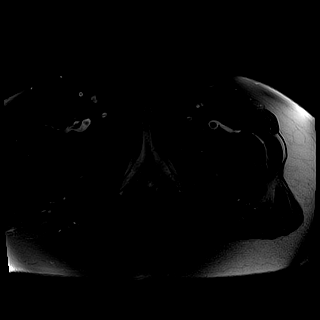
[im 27/80]
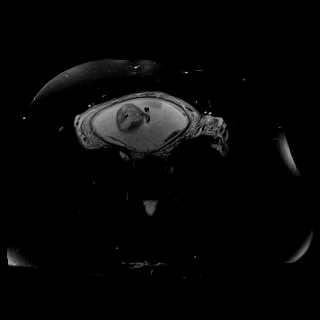
[im 53/80]
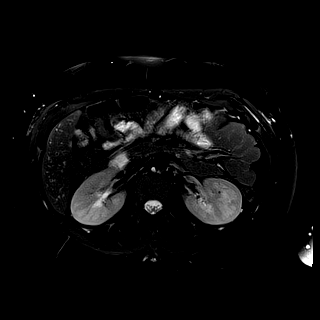
[im 80/80]
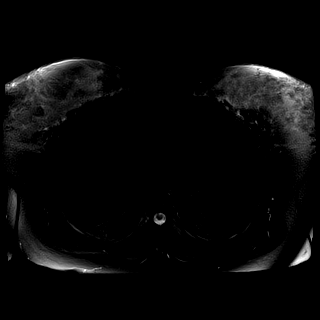

[Series 15: bSSFP · axial · 5.0mm · 0.74mm/px · z∈[-151,+83]mm · 2 of 40 slices shown (2 of 4)]
[im 1/40]
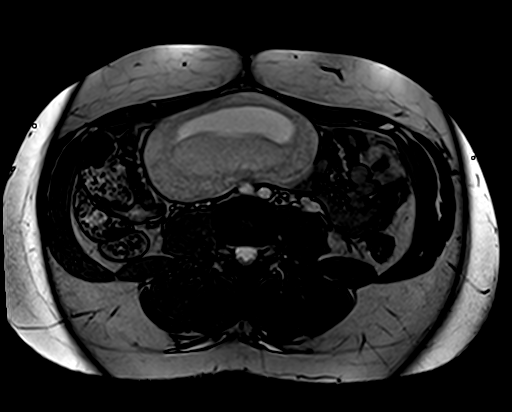
[im 40/40]
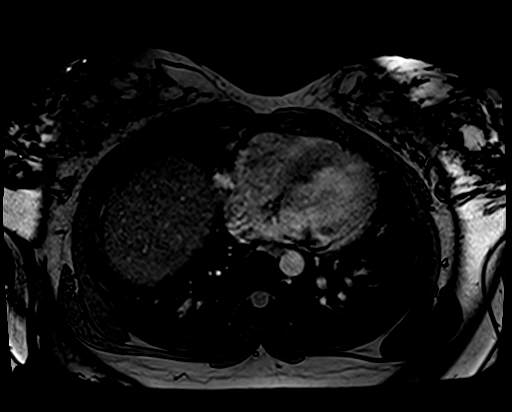

[Series 16: bSSFP · axial · 5.0mm · 0.74mm/px · z∈[-391,-157]mm · 2 of 40 slices shown (3 of 4)]
[im 1/40]
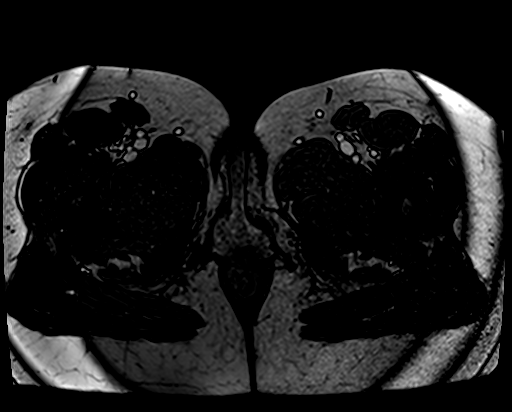
[im 40/40]
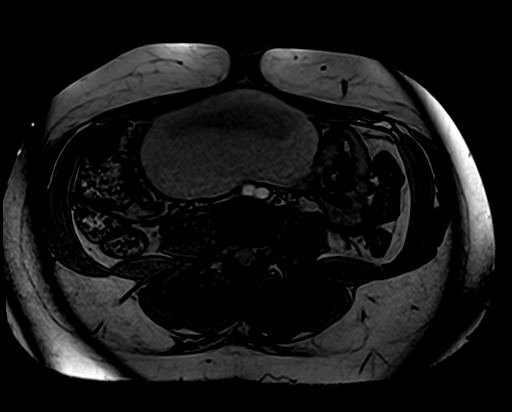

[Series 17: bSSFP · axial · 5.0mm · 0.74mm/px · z∈[-391,+83]mm · 4 of 80 slices shown (4 of 4)]
[im 1/80]
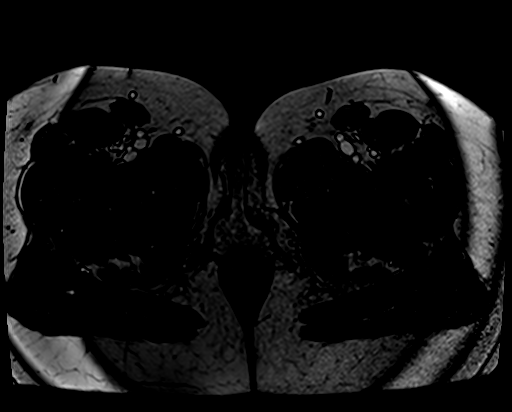
[im 27/80]
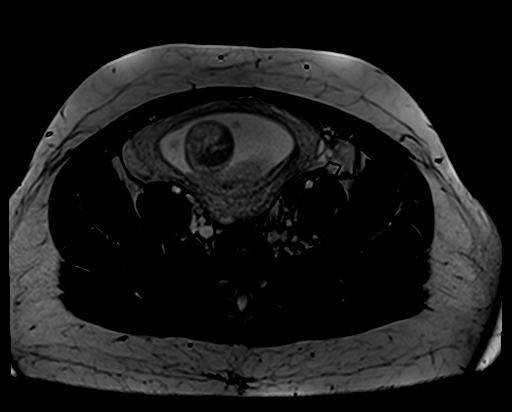
[im 53/80]
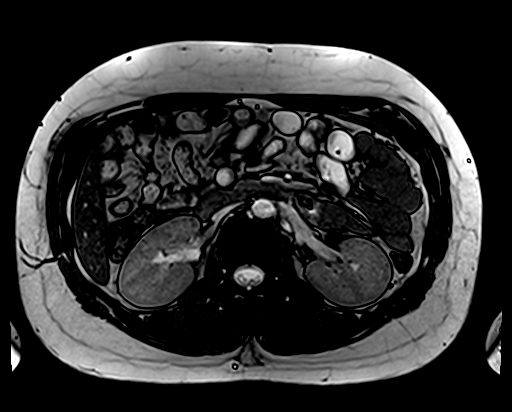
[im 80/80]
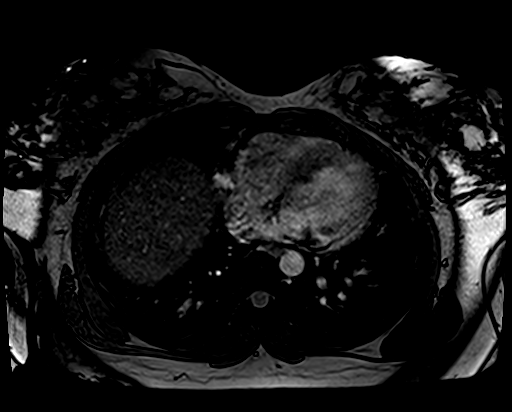

[Series 18: T1 · axial · 6.0mm · 1.48mm/px · z∈[-126,+83]mm · 2 of 30 slices shown (1 of 3)]
[im 1/30]
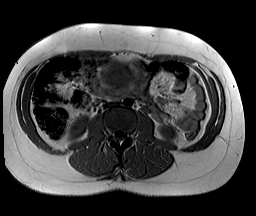
[im 30/30]
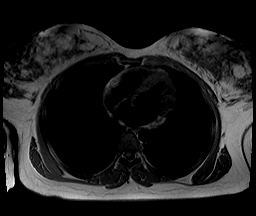

[Series 19: T1 · axial · 6.0mm · 1.48mm/px · z∈[-342,-133]mm · 2 of 30 slices shown (2 of 3)]
[im 1/30]
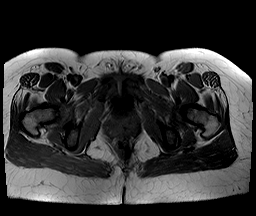
[im 30/30]
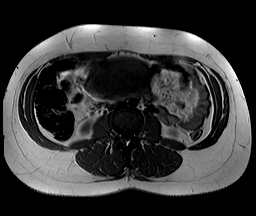

[Series 20: T1 · axial · 6.0mm · 1.48mm/px · z∈[-342,+83]mm · 3 of 60 slices shown (3 of 3)]
[im 1/60]
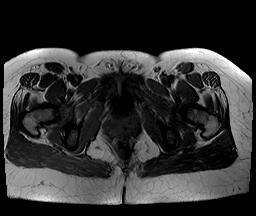
[im 30/60]
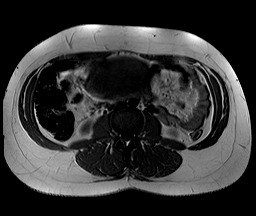
[im 60/60]
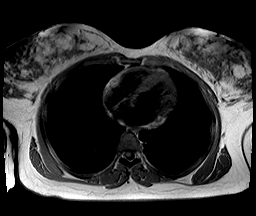

[Series 21: T1 dynamic · axial · 3.0mm · 1.41mm/px · z∈[-144,-66]mm · 2 of 80 slices shown]
[im 1/80]
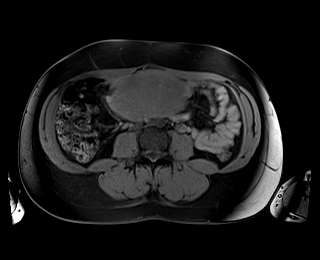
[im 27/80]
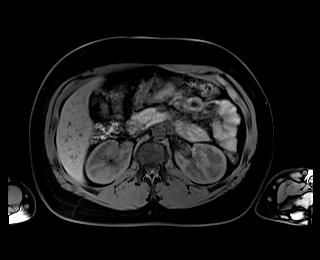

[40 of 48 positions shown; findings below may reference images not displayed]

FINDINGS: COMBINED FINDINGS FOR BOTH MR ABDOMEN AND PELVIS

Lower chest: Negative

Hepatobiliary: No mass or other parenchymal abnormality identified.
No biliary dilatation. Gallbladder surgically absent.

Pancreas: No mass, inflammatory changes, or other parenchymal
abnormality identified.

Spleen:  Within normal limits in size and appearance.

Adrenals/Urinary Tract: No masses identified. No evidence of
hydronephrosis.

Stomach/Bowel: Visualized portions within the abdomen are
unremarkable. The appendix is normal.

Vascular/Lymphatic:  Normal flow voids.

Reproductive: Gravid uterus with single intrauterine pregnancy.

Other:  None

Musculoskeletal: Normal marrow signal.
IMPRESSION: 1. Normal appendix.
2. Gravid uterus with single intrauterine pregnancy.

## 2022-06-05 DIAGNOSIS — R109 Unspecified abdominal pain: Secondary | ICD-10-CM | POA: Diagnosis not present

## 2022-06-05 DIAGNOSIS — R509 Fever, unspecified: Secondary | ICD-10-CM | POA: Diagnosis not present

## 2022-06-05 DIAGNOSIS — N7093 Salpingitis and oophoritis, unspecified: Secondary | ICD-10-CM | POA: Diagnosis not present

## 2022-06-05 DIAGNOSIS — N888 Other specified noninflammatory disorders of cervix uteri: Secondary | ICD-10-CM | POA: Diagnosis not present

## 2022-06-05 DIAGNOSIS — I959 Hypotension, unspecified: Secondary | ICD-10-CM | POA: Diagnosis not present

## 2022-06-05 DIAGNOSIS — R000 Tachycardia, unspecified: Secondary | ICD-10-CM | POA: Diagnosis not present

## 2022-06-05 DIAGNOSIS — G4489 Other headache syndrome: Secondary | ICD-10-CM | POA: Diagnosis not present

## 2022-06-05 DIAGNOSIS — R1084 Generalized abdominal pain: Secondary | ICD-10-CM | POA: Diagnosis not present

## 2022-06-06 DIAGNOSIS — N888 Other specified noninflammatory disorders of cervix uteri: Secondary | ICD-10-CM | POA: Diagnosis not present

## 2022-06-06 DIAGNOSIS — R109 Unspecified abdominal pain: Secondary | ICD-10-CM | POA: Diagnosis not present

## 2022-06-06 DIAGNOSIS — R509 Fever, unspecified: Secondary | ICD-10-CM | POA: Diagnosis not present

## 2022-10-16 DIAGNOSIS — N898 Other specified noninflammatory disorders of vagina: Secondary | ICD-10-CM | POA: Diagnosis not present

## 2022-10-16 DIAGNOSIS — R350 Frequency of micturition: Secondary | ICD-10-CM | POA: Diagnosis not present

## 2022-11-27 DIAGNOSIS — T192XXA Foreign body in vulva and vagina, initial encounter: Secondary | ICD-10-CM | POA: Diagnosis not present

## 2022-11-27 DIAGNOSIS — J189 Pneumonia, unspecified organism: Secondary | ICD-10-CM | POA: Diagnosis not present

## 2022-11-27 DIAGNOSIS — R059 Cough, unspecified: Secondary | ICD-10-CM | POA: Diagnosis not present

## 2023-07-01 DIAGNOSIS — Z3202 Encounter for pregnancy test, result negative: Secondary | ICD-10-CM | POA: Diagnosis not present

## 2023-07-01 DIAGNOSIS — Z30013 Encounter for initial prescription of injectable contraceptive: Secondary | ICD-10-CM | POA: Diagnosis not present

## 2023-09-21 ENCOUNTER — Emergency Department (HOSPITAL_COMMUNITY)

## 2023-09-21 ENCOUNTER — Emergency Department (HOSPITAL_COMMUNITY)
Admission: EM | Admit: 2023-09-21 | Discharge: 2023-09-21 | Disposition: A | Discharge status: Against Medical Advice | Attending: Emergency Medicine | Admitting: Emergency Medicine

## 2023-09-21 DIAGNOSIS — U071 COVID-19: Secondary | ICD-10-CM | POA: Diagnosis not present

## 2023-09-21 DIAGNOSIS — Z5321 Procedure and treatment not carried out due to patient leaving prior to being seen by health care provider: Secondary | ICD-10-CM | POA: Diagnosis not present

## 2023-09-21 DIAGNOSIS — J029 Acute pharyngitis, unspecified: Secondary | ICD-10-CM | POA: Diagnosis present

## 2023-09-21 LAB — COMPREHENSIVE METABOLIC PANEL WITH GFR
ALT: 24 U/L (ref 0–44)
AST: 19 U/L (ref 15–41)
Albumin: 2.9 g/dL — ABNORMAL LOW (ref 3.5–5.0)
Alkaline Phosphatase: 57 U/L (ref 38–126)
Anion gap: 9 (ref 5–15)
BUN: 5 mg/dL — ABNORMAL LOW (ref 6–20)
CO2: 20 mmol/L — ABNORMAL LOW (ref 22–32)
Calcium: 8.2 mg/dL — ABNORMAL LOW (ref 8.9–10.3)
Chloride: 108 mmol/L (ref 98–111)
Creatinine, Ser: 0.86 mg/dL (ref 0.44–1.00)
GFR, Estimated: >60 mL/min (ref >60)
Glucose, Bld: 93 mg/dL (ref 70–99)
Potassium: 3.9 mmol/L (ref 3.5–5.1)
Sodium: 137 mmol/L (ref 135–145)
Total Bilirubin: 0.6 mg/dL (ref 0.0–1.2)
Total Protein: 5.8 g/dL — ABNORMAL LOW (ref 6.5–8.1)

## 2023-09-21 LAB — CBC WITH DIFFERENTIAL/PLATELET
Abs Immature Granulocytes: 0.01 K/uL (ref 0.00–0.07)
Basophils Absolute: 0 K/uL (ref 0.0–0.1)
Basophils Relative: 1 %
Eosinophils Absolute: 0 K/uL (ref 0.0–0.5)
Eosinophils Relative: 0 %
HCT: 42.3 % (ref 36.0–46.0)
Hemoglobin: 13.6 g/dL (ref 12.0–15.0)
Immature Granulocytes: 0 %
Lymphocytes Relative: 19 %
Lymphs Abs: 0.6 K/uL — ABNORMAL LOW (ref 0.7–4.0)
MCH: 28.5 pg (ref 26.0–34.0)
MCHC: 32.2 g/dL (ref 30.0–36.0)
MCV: 88.7 fL (ref 80.0–100.0)
Monocytes Absolute: 0.4 K/uL (ref 0.1–1.0)
Monocytes Relative: 12 %
Neutro Abs: 2.3 K/uL (ref 1.7–7.7)
Neutrophils Relative %: 68 %
Platelets: 257 K/uL (ref 150–400)
RBC: 4.77 MIL/uL (ref 3.87–5.11)
RDW: 13.2 % (ref 11.5–15.5)
WBC: 3.3 K/uL — ABNORMAL LOW (ref 4.0–10.5)
nRBC: 0 % (ref 0.0–0.2)

## 2023-09-21 LAB — URINALYSIS, W/ REFLEX TO CULTURE (INFECTION SUSPECTED)
Bacteria, UA: NONE SEEN
Bilirubin Urine: NEGATIVE
Glucose, UA: NEGATIVE mg/dL
Hgb urine dipstick: NEGATIVE
Ketones, ur: NEGATIVE mg/dL
Leukocytes,Ua: NEGATIVE
Nitrite: NEGATIVE
Protein, ur: NEGATIVE mg/dL
Specific Gravity, Urine: 1.016 (ref 1.005–1.030)
pH: 7 (ref 5.0–8.0)

## 2023-09-21 LAB — I-STAT CG4 LACTIC ACID, ED: Lactic Acid, Venous: 0.6 mmol/L (ref 0.5–1.9)

## 2023-09-21 LAB — RESP PANEL BY RT-PCR (RSV, FLU A&B, COVID)  RVPGX2
Influenza A by PCR: NEGATIVE
Influenza B by PCR: NEGATIVE
Resp Syncytial Virus by PCR: NEGATIVE
SARS Coronavirus 2 by RT PCR: POSITIVE — AB

## 2023-09-21 LAB — HCG, SERUM, QUALITATIVE: Preg, Serum: NEGATIVE

## 2023-09-21 LAB — GROUP A STREP BY PCR: Group A Strep by PCR: NOT DETECTED

## 2023-09-21 MED ORDER — ACETAMINOPHEN 325 MG PO TABS
650.0000 mg | ORAL_TABLET | Freq: Once | ORAL | Status: AC | PRN
  Administered 2023-09-21: 650 mg via ORAL

## 2023-09-21 MED ORDER — ACETAMINOPHEN 325 MG PO TABS
325.0000 mg | ORAL_TABLET | Freq: Once | ORAL | Status: DC
  Filled 2023-09-21: qty 1

## 2023-09-21 NOTE — ED Notes (Signed)
 Pt transported to xray

## 2023-09-21 NOTE — ED Triage Notes (Signed)
 Pt BIB EMS from home, feeling sick x3 days. Son has been sick as well. Headache, congestion, chest pain when coughing. No N/V currently, vomited yesterday at 4pm. Has not been able to eat since. Holding some water down. AOX4.

## 2023-09-21 NOTE — ED Notes (Signed)
 PT states she wants to leave and go to UC.  I encouraged her that by time there was only one person ahead of her but she still wanted to leave. PT left the ED.

## 2023-09-21 NOTE — ED Provider Triage Note (Signed)
 Emergency Medicine Provider Triage Evaluation Note  Sherri Clark , a 33 y.o. female  was evaluated in triage.  Pt complains of sore throat, headache, body aches, cough, congestion, feeling unwell, poor appetite, ongoing for about 2 days.  Reports her son has similar symptoms and is in the pediatric ED with his grandmother.  She denies smoking history any other medical issues.  Review of Systems  Positive: Headache, muscle aches, sore throat, body aches, nausea Negative: Loss of consciousness  Physical Exam  BP (!) 122/53   Pulse (!) 106   Temp (!) 101.2 F (38.4 C) (Oral)   Resp 18   SpO2 98%  Gen:   Awake, no distress   Resp:  Normal effort  MSK:   Moves extremities without difficulty  Other:  No tonsillar exudates noted on exam; no nuchal rigidity, no photophobia, no conspicuous rash  Medical Decision Making  Medically screening exam initiated at 12:38 PM.  Appropriate orders placed.  Alianis Kusek was informed that the remainder of the evaluation will be completed by another provider, this initial triage assessment does not replace that evaluation, and the importance of remaining in the ED until their evaluation is complete.  Patient is here with potential viral syndrome.  Additional triage labs are ordered including flu swab, strep swab.  She is clinically well-appearing and have a low suspicion for bacterial sepsis.   Cottie Donnice PARAS, MD 09/21/23 [phone]
